# Patient Record
Sex: Female | Born: 1975 | Race: Black or African American | Hispanic: No | Marital: Single | Smoking: Current every day smoker
Health system: Southern US, Community
[De-identification: ages and names within clinical notes are randomized; demographics above are authoritative.]

## PROBLEM LIST (undated history)

## (undated) DIAGNOSIS — F419 Anxiety disorder, unspecified: Secondary | ICD-10-CM

## (undated) DIAGNOSIS — D649 Anemia, unspecified: Secondary | ICD-10-CM

---

## 2009-11-26 ENCOUNTER — Inpatient Hospital Stay (HOSPITAL_COMMUNITY): Admission: AD | Admit: 2009-11-26 | Discharge: 2009-11-26 | Payer: Self-pay | Admitting: Obstetrics & Gynecology

## 2009-11-26 ENCOUNTER — Ambulatory Visit: Payer: Self-pay | Admitting: Family Medicine

## 2010-08-07 LAB — URINALYSIS, ROUTINE W REFLEX MICROSCOPIC
Bilirubin Urine: NEGATIVE
Ketones, ur: NEGATIVE mg/dL
Nitrite: NEGATIVE
pH: 7.5 (ref 5.0–8.0)

## 2010-08-07 LAB — WET PREP, GENITAL
Trich, Wet Prep: NONE SEEN
Yeast Wet Prep HPF POC: NONE SEEN

## 2010-08-07 LAB — GC/CHLAMYDIA PROBE AMP, GENITAL: Chlamydia, DNA Probe: NEGATIVE

## 2011-06-30 ENCOUNTER — Emergency Department (HOSPITAL_COMMUNITY): Payer: Self-pay

## 2011-06-30 ENCOUNTER — Encounter (HOSPITAL_COMMUNITY): Payer: Self-pay | Admitting: Emergency Medicine

## 2011-06-30 ENCOUNTER — Emergency Department (HOSPITAL_COMMUNITY)
Admission: EM | Admit: 2011-06-30 | Discharge: 2011-06-30 | Disposition: A | Discharge status: Home / Self Care | Payer: Self-pay | Attending: Emergency Medicine | Admitting: Emergency Medicine

## 2011-06-30 DIAGNOSIS — Y998 Other external cause status: Secondary | ICD-10-CM | POA: Insufficient documentation

## 2011-06-30 DIAGNOSIS — F1010 Alcohol abuse, uncomplicated: Secondary | ICD-10-CM | POA: Insufficient documentation

## 2011-06-30 DIAGNOSIS — M542 Cervicalgia: Secondary | ICD-10-CM | POA: Insufficient documentation

## 2011-06-30 DIAGNOSIS — R51 Headache: Secondary | ICD-10-CM | POA: Insufficient documentation

## 2011-06-30 DIAGNOSIS — T148XXA Other injury of unspecified body region, initial encounter: Secondary | ICD-10-CM

## 2011-06-30 DIAGNOSIS — F17200 Nicotine dependence, unspecified, uncomplicated: Secondary | ICD-10-CM | POA: Insufficient documentation

## 2011-06-30 MED ORDER — IBUPROFEN 800 MG PO TABS: 800.0000 mg | ORAL_TABLET | Freq: Three times a day (TID) | ORAL | Status: AC

## 2011-06-30 MED ORDER — IBUPROFEN 800 MG PO TABS
800.0000 mg | ORAL_TABLET | Freq: Once | ORAL | Status: DC
  Filled 2011-06-30: qty 1

## 2011-06-30 NOTE — ED Notes (Signed)
Patient  refused  To apply ice as requested by Dr.
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
## 2011-08-26 ENCOUNTER — Emergency Department (HOSPITAL_COMMUNITY)
Admission: EM | Admit: 2011-08-26 | Discharge: 2011-08-26 | Disposition: A | Discharge status: Home / Self Care | Payer: Self-pay | Attending: Emergency Medicine | Admitting: Emergency Medicine

## 2011-08-26 ENCOUNTER — Emergency Department (HOSPITAL_COMMUNITY): Payer: Self-pay

## 2011-08-26 ENCOUNTER — Encounter (HOSPITAL_COMMUNITY): Payer: Self-pay

## 2011-08-26 DIAGNOSIS — F17200 Nicotine dependence, unspecified, uncomplicated: Secondary | ICD-10-CM | POA: Insufficient documentation

## 2011-08-26 DIAGNOSIS — S161XXA Strain of muscle, fascia and tendon at neck level, initial encounter: Secondary | ICD-10-CM

## 2011-08-26 DIAGNOSIS — W19XXXA Unspecified fall, initial encounter: Secondary | ICD-10-CM | POA: Insufficient documentation

## 2011-08-26 DIAGNOSIS — M545 Low back pain, unspecified: Secondary | ICD-10-CM | POA: Insufficient documentation

## 2011-08-26 DIAGNOSIS — S39012A Strain of muscle, fascia and tendon of lower back, initial encounter: Secondary | ICD-10-CM

## 2011-08-26 DIAGNOSIS — S139XXA Sprain of joints and ligaments of unspecified parts of neck, initial encounter: Secondary | ICD-10-CM | POA: Insufficient documentation

## 2011-08-26 DIAGNOSIS — Y9289 Other specified places as the place of occurrence of the external cause: Secondary | ICD-10-CM | POA: Insufficient documentation

## 2011-08-26 DIAGNOSIS — M542 Cervicalgia: Secondary | ICD-10-CM | POA: Insufficient documentation

## 2011-08-26 DIAGNOSIS — S335XXA Sprain of ligaments of lumbar spine, initial encounter: Secondary | ICD-10-CM | POA: Insufficient documentation

## 2011-08-26 MED ORDER — HYDROCODONE-ACETAMINOPHEN 5-325 MG PO TABS: 1.0000 | ORAL_TABLET | Freq: Four times a day (QID) | ORAL | Status: AC | PRN

## 2011-08-26 MED ORDER — PREDNISONE 50 MG PO TABS: 50.0000 mg | ORAL_TABLET | Freq: Every day | ORAL | Status: AC

## 2011-08-26 MED ORDER — DIAZEPAM 5 MG PO TABS: 5.0000 mg | ORAL_TABLET | Freq: Three times a day (TID) | ORAL | Status: AC | PRN
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
## 2011-08-26 NOTE — ED Notes (Signed)
MD at bedside. EDPA Thayer Ohm in to see this pt and answer questions for this pt

## 2011-08-26 NOTE — Discharge Instructions (Signed)
Your x-rays are normal today.  Use ice and heat on her back and neck. return here as needed
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
## 2011-08-28 NOTE — ED Provider Notes (Signed)
Medical screening examination/treatment/procedure(s) were performed by non-physician practitioner and as supervising physician I was immediately available for consultation/collaboration.   Hurman Horn, MD 08/28/11 2209

## 2011-09-17 ENCOUNTER — Emergency Department (HOSPITAL_COMMUNITY)
Admission: EM | Admit: 2011-09-17 | Discharge: 2011-09-17 | Disposition: A | Discharge status: Home / Self Care | Payer: No Typology Code available for payment source | Attending: Emergency Medicine | Admitting: Emergency Medicine

## 2011-09-17 ENCOUNTER — Emergency Department (HOSPITAL_COMMUNITY): Payer: No Typology Code available for payment source

## 2011-09-17 ENCOUNTER — Encounter (HOSPITAL_COMMUNITY): Payer: Self-pay | Admitting: *Deleted

## 2011-09-17 DIAGNOSIS — S0990XA Unspecified injury of head, initial encounter: Secondary | ICD-10-CM | POA: Insufficient documentation

## 2011-09-17 DIAGNOSIS — T148XXA Other injury of unspecified body region, initial encounter: Secondary | ICD-10-CM | POA: Insufficient documentation

## 2011-09-17 DIAGNOSIS — R110 Nausea: Secondary | ICD-10-CM | POA: Insufficient documentation

## 2011-09-17 DIAGNOSIS — S139XXA Sprain of joints and ligaments of unspecified parts of neck, initial encounter: Secondary | ICD-10-CM | POA: Insufficient documentation

## 2011-09-17 DIAGNOSIS — W19XXXA Unspecified fall, initial encounter: Secondary | ICD-10-CM | POA: Insufficient documentation

## 2011-09-17 DIAGNOSIS — S161XXA Strain of muscle, fascia and tendon at neck level, initial encounter: Secondary | ICD-10-CM

## 2011-09-17 DIAGNOSIS — M542 Cervicalgia: Secondary | ICD-10-CM | POA: Insufficient documentation

## 2011-09-17 DIAGNOSIS — IMO0002 Reserved for concepts with insufficient information to code with codable children: Secondary | ICD-10-CM | POA: Insufficient documentation

## 2011-09-17 DIAGNOSIS — S40211A Abrasion of right shoulder, initial encounter: Secondary | ICD-10-CM

## 2011-09-17 MED ORDER — DIAZEPAM 5 MG PO TABS: 5.0000 mg | ORAL_TABLET | Freq: Two times a day (BID) | ORAL | Status: AC

## 2011-09-17 MED ORDER — DIAZEPAM 5 MG PO TABS
5.0000 mg | ORAL_TABLET | Freq: Once | ORAL | Status: AC
  Administered 2011-09-17: 5 mg via ORAL
  Filled 2011-09-17: qty 1

## 2011-09-17 MED ORDER — TETANUS-DIPHTH-ACELL PERTUSSIS 5-2.5-18.5 LF-MCG/0.5 IM SUSP
0.5000 mL | Freq: Once | INTRAMUSCULAR | Status: AC
  Administered 2011-09-17: 0.5 mL via INTRAMUSCULAR
  Filled 2011-09-17: qty 0.5

## 2011-09-17 MED ORDER — HYDROCODONE-ACETAMINOPHEN 5-325 MG PO TABS: 1.0000 | ORAL_TABLET | ORAL | Status: AC | PRN

## 2011-09-17 MED ORDER — HYDROCODONE-ACETAMINOPHEN 5-325 MG PO TABS
1.0000 | ORAL_TABLET | Freq: Once | ORAL | Status: AC
  Administered 2011-09-17: 1 via ORAL
  Filled 2011-09-17: qty 1

## 2011-09-17 MED ORDER — IBUPROFEN 400 MG PO TABS: 400.0000 mg | ORAL_TABLET | Freq: Four times a day (QID) | ORAL | Status: AC | PRN

## 2011-09-17 MED ORDER — IBUPROFEN 800 MG PO TABS
800.0000 mg | ORAL_TABLET | Freq: Once | ORAL | Status: AC
  Administered 2011-09-17: 800 mg via ORAL
  Filled 2011-09-17: qty 1

## 2011-09-17 MED ORDER — OXYCODONE-ACETAMINOPHEN 5-325 MG PO TABS: 1.0000 | ORAL_TABLET | Freq: Once | ORAL | Status: DC

## 2011-09-17 MED ORDER — BACITRACIN ZINC 500 UNIT/GM EX OINT
1.0000 "application " | TOPICAL_OINTMENT | Freq: Two times a day (BID) | CUTANEOUS | Status: DC
  Administered 2011-09-17: 1 via TOPICAL
  Filled 2011-09-17 (×2): qty 0.9
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
## 2011-09-17 NOTE — Discharge Instructions (Signed)
Please read over the instructions below. The xrays of your neck tonight show no acute injury. Expect to be sore for a couple of days. Take the medications as directed. Use ice to sore areas then heat. You could have headaches associated w/ your injury for 1 to 3 weeks but they should resolve. If not you will need to follow up with your primary doctor. Keep the abrasion to your shoulder clean. Apply a topical antibiotic ointment and keep covered for the next several days.   Abrasions An abrasion is a scraped area on the skin. Abrasions do not go through all layers of the skin.  HOME CARE  Change any bandages (dressings) as told by your doctor. If the bandage sticks, soak it off with warm, soapy water. Change the bandage if it gets wet, dirty, or starts to smell.   Wash the area with soap and water twice a day. Rinse off the soap. Pat the area dry with a clean towel.   Look at the injured area for signs of infection. Infection signs include redness, puffiness (swelling), tenderness, or yellowish white fluid (pus) coming from the wound.   Apply medicated cream as told by your doctor.   Only take medicine as told by your doctor.   Follow up with your doctor as told.  GET HELP RIGHT AWAY IF:   You have more pain in your wound.   You have redness, puffiness (swelling), or tenderness around your wound.   You have yellowish white fluid (pus) coming from your wound.   You have a fever.   A bad smell is coming from the wound or bandage.  MAKE SURE YOU:   Understand these instructions.   Will watch your condition.   Will get help right away if you are not doing well or get worse.  Document Released: 10/25/2007 Document Revised: 04/27/2011 Document Reviewed: 04/11/2011 [facility] Patient Information 2012 [place], [place].Cervical Strain Care After A cervical strain is when the muscles and ligaments in your neck have been stretched. The bones are not broken. If you had any problems moving your  arms or legs immediately after the injury, even if the problem has gone away, make sure to tell this to your caregiver.  HOME CARE INSTRUCTIONS   While awake, apply ice packs to the neck or areas of pain about every 1 to 2 hours, for 15 to 20 minutes at a time. Do this for 2 days. If you were given a cervical collar for support, ask your caregiver if you may remove it for bathing or applying ice.   If given a cervical collar, wear as instructed. Do not remove any collar unless instructed by a caregiver.   Only take over-the-counter or prescription medicines for pain, discomfort, or fever as directed by your caregiver.  Recheck with the hospital or clinic after a radiologist has read your X-rays. Recheck with the hospital or clinic to make sure the initial readings are correct. Do this also to determine if you need further studies. It is your responsibility to find out your X-ray results. X-rays are sometimes repeated in one week to ten days. These are often repeated to make sure that a hairline fracture was not overlooked. Ask your caregiver how you are to find out about your radiology (X-ray) results. SEEK IMMEDIATE MEDICAL CARE IF:   You have increasing pain in your neck.   You develop difficulties swallowing or breathing.   You have numbness, weakness, or movement problems in the arms or legs.  You have difficulty walking.   You develop bowel or bladder retention or incontinence.   You have problems with walking.  MAKE SURE YOU:   Understand these instructions.   Will watch your condition.   Will get help right away if you are not doing well or get worse.  Document Released: 05/08/2005 Document Revised: 01/18/2011 Document Reviewed: 12/20/2007 [facility] Patient Information 2012 [place], [place].Head Injury, Adult A head injury happens when the head is hit really hard. A head injury may cause sleepiness, headache, throwing up (vomiting), and problems seeing. If the head injury is  really bad, you may need to stay in the hospital. HOME CARE  Have someone with you for the first 24 hours. This person should wake you up every 1 hour to check on your condition.   Only drink water or clear fluid for the rest of the day. Then, go back to your regular diet.   Only take medicines as told by your doctor. Do not take aspirin.   Do not drink alcohol for 2 days.   Do not take medicines that help your relax (sedatives) for 2 days.  Side effects may happen for up to 7 to 10 days. Watch for new problems. GET HELP RIGHT AWAY IF:   You are confused or sleepy.   You cannot be woken up.   You feel sick to your stomach (nauseous) or keep throwing up.   Your dizziness or unsteadiness is get worse, or your cannot walk.   You start to shake (convulse) or pass out (faint).   You have very bad, lasting headaches that are not helped by medicine.   You cannot use your arms or legs like normal.   You have clear or bloody fluid coming from your nose or ears.  MAKE SURE YOU:   Understand these instructions.   Will watch your condition.   Will get help right away if you are not doing well or get worse.  Document Released: 04/20/2008 Document Revised: 04/27/2011 Document Reviewed: 03/24/2009 [facility] Patient Information 2012 [place], [place].Muscle Strain A muscle strain (pulled muscle) happens when a muscle is over-stretched. Recovery usually takes 5 to 6 weeks.  HOME CARE   Put ice on the injured area.   Put ice in a plastic bag.   Place a towel between your skin and the bag.   Leave the ice on for 15 to 20 minutes at a time, every hour for the first 2 days.   Do not use the muscle for several days or until your doctor says you can. Do not use the muscle if you have pain.   Wrap the injured area with an elastic bandage for comfort. Do not put it on too tightly.   Only take medicine as told by your doctor.   Warm up before exercise. This helps prevent muscle strains.    GET HELP RIGHT AWAY IF:  There is increased pain or puffiness (swelling) in the affected area. MAKE SURE YOU:   Understand these instructions.   Will watch your condition.   Will get help right away if you are not doing well or get worse.  Document Released: 02/15/2008 Document Revised: 04/27/2011 Document Reviewed: 02/15/2008 [facility] Patient Information 2012 [place], [place].Concussion and Brain Injury A blow to the head can stop the brain from working normally (concussion). It is usually not life-threatening. However, the results of the injury can be serious. Problems caused by the injury might show up right away or days or weeks later. Getting  better might take some time. HOME CARE  Rest your body. Ways to rest your body include:   Getting plenty of sleep at night.   Going to sleep early.   Taking naps during the day when you feel tired.   Limit activities that require a lot of thought. This includes:   Time spent with homework.   Time spent with work related to a job.   TV watching.   Computer use.   Return to normal activities (driving, work, school) only when your doctor says it is okay.   Avoid high impact activity and sports until your doctor says it is okay.   Take medicines only as told by your doctor.   Do not drink alcohol until your doctor says it is okay.   Do not make important decisions without help until you feel better.   Follow up with your doctor as told.  GET HELP RIGHT AWAY IF:  You, your family, or your friends notice that:  You have bad headaches, or they get worse.   You have weakness, loss of feeling (numbness), or you feel off balance.   You keep throwing up (vomiting).   You feel tired or pass out (faint).   One black center of your eye (pupil) is larger than the other.   You twitch or shake (seize).   Your speech is not clear (slurred).   You are confused, restless, easily angered (agitated), or annoyed (irritable).   You  cannot recognize or respond to people or activities.   You have neck pain.   You have trouble being woken up.   Your behavior changes.  MAKE SURE YOU:   Understand these instructions.   Will watch your condition.   Will get help right away if you are not doing well or get worse.  Document Released: 04/26/2009 Document Revised: 04/27/2011 Document Reviewed: 04/26/2009 [facility] Patient Information 2012 [employer], [place].Cervical Strain Care After A cervical strain is when the muscles and ligaments in your neck have been stretched. The bones are not broken. If you had any problems moving your arms or legs immediately after the injury, even if the problem has gone away, make sure to tell this to your caregiver.  HOME CARE INSTRUCTIONS   While awake, apply ice packs to the neck or areas of pain about every 1 to 2 hours, for 15 to 20 minutes at a time. Do this for 2 days. If you were given a cervical collar for support, ask your caregiver if you may remove it for bathing or applying ice.   If given a cervical collar, wear as instructed. Do not remove any collar unless instructed by a caregiver.   Only take over-the-counter or prescription medicines for pain, discomfort, or fever as directed by your caregiver.  Recheck with the hospital or clinic after a radiologist has read your X-rays. Recheck with the hospital or clinic to make sure the initial readings are correct. Do this also to determine if you need further studies. It is your responsibility to find out your X-ray results. X-rays are sometimes repeated in one week to ten days. These are often repeated to make sure that a hairline fracture was not overlooked. Ask your caregiver how you are to find out about your radiology (X-ray) results. SEEK IMMEDIATE MEDICAL CARE IF:   You have increasing pain in your neck.   You develop difficulties swallowing or breathing.   You have numbness, weakness, or movement problems in the arms or legs.  You have difficulty walking.   You develop bowel or bladder retention or incontinence.   You have problems with walking.  MAKE SURE YOU:   Understand these instructions.   Will watch your condition.   Will get help right away if you are not doing well or get worse.  Document Released: 05/08/2005 Document Revised: 01/18/2011 Document Reviewed: 12/20/2007 [facility] Patient Information 2012 [place], [place].

## 2011-09-20 NOTE — ED Provider Notes (Signed)
Medical screening examination/treatment/procedure(s) were performed by non-physician practitioner and as supervising physician I was immediately available for consultation/collaboration.  Olivia Mackie, MD 09/20/11 [id number]

## 2015-01-28 ENCOUNTER — Other Ambulatory Visit (HOSPITAL_COMMUNITY): Payer: Self-pay | Admitting: *Deleted

## 2015-01-28 DIAGNOSIS — Z1231 Encounter for screening mammogram for malignant neoplasm of breast: Secondary | ICD-10-CM

## 2015-02-08 ENCOUNTER — Ambulatory Visit (HOSPITAL_COMMUNITY): Payer: No Typology Code available for payment source

## 2015-02-11 ENCOUNTER — Ambulatory Visit (HOSPITAL_COMMUNITY): Payer: Self-pay

## 2015-03-04 ENCOUNTER — Encounter: Payer: Self-pay | Admitting: Obstetrics and Gynecology

## 2015-03-04 ENCOUNTER — Ambulatory Visit (INDEPENDENT_AMBULATORY_CARE_PROVIDER_SITE_OTHER): Payer: Self-pay | Admitting: Obstetrics and Gynecology

## 2015-03-04 VITALS — BP 108/75 | HR 82 | Temp 98.7°F | Ht 61.0 in | Wt 106.0 lb

## 2015-03-04 DIAGNOSIS — D259 Leiomyoma of uterus, unspecified: Secondary | ICD-10-CM

## 2015-03-04 DIAGNOSIS — N946 Dysmenorrhea, unspecified: Secondary | ICD-10-CM

## 2015-03-04 NOTE — Progress Notes (Signed)
   Subjective:    Patient ID: Biana Haggar, female    DOB: 26-Jan-1976, 39 y.o.   MRN: [id number]  HPI  39 yo G0P0 referred for the evaluation of enlarged uterus and irregular menses. Patient reports monthly menses every 28-32 days lasting 3-4 days. She states her cycles are heavy with passage of clots. She also experiences some dysmenorrhea. Patient states she was told she has fibroids for a few years but has never been formally evaluated for it. She is not currently sexually active. She is not using any forms of contraception  History reviewed. No pertinent past medical history. History reviewed. No pertinent past surgical history. History reviewed. No pertinent family history. Social History  Substance Use Topics  . Smoking status: Current Every Day Smoker -- 1.00 packs/day    Types: Cigarettes  . Smokeless tobacco: None  . Alcohol Use: Yes     Review of Systems See pertinent in HPI    Objective:   Physical Exam GENERAL: Well-developed, well-nourished female in no acute distress.  ABDOMEN: Soft, nontender, nondistended. Palpable uterus PELVIC: Normal external female genitalia. Vagina is pink and rugated.  Normal discharge. Normal appearing cervix. Uterus is 14-16 weeks in size with palpable fibroids. No adnexal mass or tenderness. EXTREMITIES: No cyanosis, clubbing, or edema, 2+ distal pulses.      Assessment & Plan:  39 yo nulliparous with fibroid uterus and pelvic pain - Will obtain a pelvic ultrasound for further evaluation - RTC in 2 weeks for review of ultrasound results and management options

## 2015-03-10 ENCOUNTER — Ambulatory Visit (HOSPITAL_COMMUNITY): Admission: RE | Admit: 2015-03-10 | Payer: Self-pay | Source: Ambulatory Visit

## 2015-03-16 ENCOUNTER — Encounter: Payer: Self-pay | Admitting: *Deleted

## 2015-03-22 ENCOUNTER — Ambulatory Visit (HOSPITAL_COMMUNITY)
Admission: RE | Admit: 2015-03-22 | Discharge: 2015-03-22 | Disposition: A | Discharge status: Home / Self Care | Payer: Self-pay | Source: Ambulatory Visit | Attending: Obstetrics and Gynecology | Admitting: Obstetrics and Gynecology

## 2015-03-22 DIAGNOSIS — D259 Leiomyoma of uterus, unspecified: Secondary | ICD-10-CM | POA: Insufficient documentation

## 2015-10-30 ENCOUNTER — Emergency Department (HOSPITAL_COMMUNITY): Payer: Self-pay

## 2015-10-30 ENCOUNTER — Emergency Department (HOSPITAL_COMMUNITY)
Admission: EM | Admit: 2015-10-30 | Discharge: 2015-10-30 | Disposition: A | Discharge status: Home / Self Care | Payer: Self-pay | Attending: Emergency Medicine | Admitting: Emergency Medicine

## 2015-10-30 ENCOUNTER — Encounter (HOSPITAL_COMMUNITY): Payer: Self-pay | Admitting: Emergency Medicine

## 2015-10-30 DIAGNOSIS — R1084 Generalized abdominal pain: Secondary | ICD-10-CM

## 2015-10-30 DIAGNOSIS — F17210 Nicotine dependence, cigarettes, uncomplicated: Secondary | ICD-10-CM | POA: Insufficient documentation

## 2015-10-30 DIAGNOSIS — D259 Leiomyoma of uterus, unspecified: Secondary | ICD-10-CM | POA: Insufficient documentation

## 2015-10-30 DIAGNOSIS — Z79899 Other long term (current) drug therapy: Secondary | ICD-10-CM | POA: Insufficient documentation

## 2015-10-30 DIAGNOSIS — F10129 Alcohol abuse with intoxication, unspecified: Secondary | ICD-10-CM | POA: Insufficient documentation

## 2015-10-30 LAB — CBC WITH DIFFERENTIAL/PLATELET
BASOS ABS: 0 10*3/uL (ref 0.0–0.1)
Basophils Relative: 0 %
EOS PCT: 1 %
Eosinophils Absolute: 0.1 10*3/uL (ref 0.0–0.7)
HEMATOCRIT: 36.8 % (ref 36.0–46.0)
Hemoglobin: 13.1 g/dL (ref 12.0–15.0)
LYMPHS PCT: 29 %
Lymphs Abs: 2.3 10*3/uL (ref 0.7–4.0)
MCH: 34.9 pg — ABNORMAL HIGH (ref 26.0–34.0)
MCHC: 35.6 g/dL (ref 30.0–36.0)
MCV: 98.1 fL (ref 78.0–100.0)
MONO ABS: 0.9 10*3/uL (ref 0.1–1.0)
MONOS PCT: 11 %
Neutro Abs: 4.6 10*3/uL (ref 1.7–7.7)
Neutrophils Relative %: 59 %
PLATELETS: 372 10*3/uL (ref 150–400)
RBC: 3.75 MIL/uL — ABNORMAL LOW (ref 3.87–5.11)
RDW: 12.5 % (ref 11.5–15.5)
WBC: 7.9 10*3/uL (ref 4.0–10.5)

## 2015-10-30 LAB — ETHANOL: ALCOHOL ETHYL (B): 161 mg/dL — AB (ref <5)

## 2015-10-30 LAB — COMPREHENSIVE METABOLIC PANEL
ALBUMIN: 4.6 g/dL (ref 3.5–5.0)
ALT: 11 U/L — AB (ref 14–54)
AST: 19 U/L (ref 15–41)
Alkaline Phosphatase: 43 U/L (ref 38–126)
Anion gap: 8 (ref 5–15)
BILIRUBIN TOTAL: 0.2 mg/dL — AB (ref 0.3–1.2)
BUN: 8 mg/dL (ref 6–20)
CO2: 23 mmol/L (ref 22–32)
CREATININE: 0.67 mg/dL (ref 0.44–1.00)
Calcium: 8.8 mg/dL — ABNORMAL LOW (ref 8.9–10.3)
Chloride: 107 mmol/L (ref 101–111)
GFR calc Af Amer: >60 mL/min (ref >60)
GLUCOSE: 92 mg/dL (ref 65–99)
Potassium: 3.2 mmol/L — ABNORMAL LOW (ref 3.5–5.1)
Sodium: 138 mmol/L (ref 135–145)
TOTAL PROTEIN: 7.8 g/dL (ref 6.5–8.1)

## 2015-10-30 LAB — LIPASE, BLOOD: LIPASE: 42 U/L (ref 11–51)

## 2015-10-30 LAB — I-STAT BETA HCG BLOOD, ED (MC, WL, AP ONLY): I-stat hCG, quantitative: <5 m[IU]/mL (ref <5)

## 2015-10-30 MED ORDER — SODIUM CHLORIDE 0.9 % IV BOLUS (SEPSIS)
1000.0000 mL | Freq: Once | INTRAVENOUS | Status: AC
  Administered 2015-10-30: 1000 mL via INTRAVENOUS

## 2015-10-30 MED ORDER — MORPHINE SULFATE (PF) 4 MG/ML IV SOLN: 4.0000 mg | Freq: Once | INTRAVENOUS | Status: DC

## 2015-10-30 MED ORDER — POTASSIUM CHLORIDE CRYS ER 20 MEQ PO TBCR
40.0000 meq | EXTENDED_RELEASE_TABLET | Freq: Once | ORAL | Status: AC
  Administered 2015-10-30: 40 meq via ORAL
  Filled 2015-10-30: qty 2

## 2015-10-30 MED ORDER — DIATRIZOATE MEGLUMINE & SODIUM 66-10 % PO SOLN
15.0000 mL | Freq: Once | ORAL | Status: AC
  Administered 2015-10-30: 15 mL via ORAL

## 2015-10-30 MED ORDER — ONDANSETRON HCL 4 MG/2ML IJ SOLN: 4.0000 mg | Freq: Once | INTRAMUSCULAR | Status: DC

## 2015-10-30 MED ORDER — IOPAMIDOL (ISOVUE-300) INJECTION 61%
100.0000 mL | Freq: Once | INTRAVENOUS | Status: AC | PRN
  Administered 2015-10-30: 100 mL via INTRAVENOUS

## 2015-10-30 NOTE — Discharge Instructions (Signed)
You have been seen today for abdominal pain and intoxication. It is suspected the mass in the abdomen is from the swelling of the uterus due to fibroids. This was confirmed on the abdominal CT. Follow-up with OB/GYN as soon as possible on this matter. Naproxen or ibuprofen for pain. Follow up with PCP as needed. Return to ED should symptoms worsen.

## 2015-10-30 NOTE — ED Notes (Signed)
PA at bedside.

## 2015-10-30 NOTE — ED Notes (Signed)
Patient refusing IV, meds, labs at this time-will approach at later time

## 2015-10-30 NOTE — ED Notes (Signed)
Tolerated PO contrast-waiting for labs to result prior to CT

## 2015-10-30 NOTE — ED Notes (Signed)
Patient had just used rest

## 2015-10-30 NOTE — ED Notes (Signed)
Bed: [id number] Expected date:  Expected time:  Means of arrival:  Comments: EMS menstrual pain / ETOH

## 2015-10-30 NOTE — ED Notes (Signed)
Per EMS pt reported drinking two 40oz Beers tonight and also reporting menstrual cramping.

## 2015-10-30 NOTE — ED Notes (Signed)
PT refuse to have blood work. RN have been made aware

## 2015-10-30 NOTE — ED Notes (Signed)
Pt laying in bed eyes closed equal chest rise and fall no acute distress. Awaiting further orders by provider.

## 2015-10-30 NOTE — ED Provider Notes (Signed)
MSE was initiated and I personally evaluated the patient and placed orders (if any) at  4:20 AM on October 30, 2015.  The patient appears stable so that the remainder of the MSE may be completed by another provider.  The patient arrives via EMS with reported complaint of abdominal cramping secondary to menstrual cramping. Here the patient is arousable but barely coherent. She reportedly had been drinking tonight. She sits up in the bed but her complaint is labile and does not stay on topic. She complains of "hot going up my back" and points to her abdomen. She then lies back down and goes to sleep.  Reliable history is unobtainable at this time. VSS. Hypertensive - being rechecked and will monitor. Patient will be observed and further assessment will be attempted when she is clinically more sober and can state what her needs are.   BP 109/76 mmHg  Pulse 79  Temp(Src) 98.1 F (36.7 C) (Oral)  Resp 19  SpO2 99%  LMP 10/30/2015  Arousable but appears intoxicated Moves with coordination and without difficulty.  Somnolent, in NAD and no significant pain  Patient care will be signed out to [employer], PA-C, pending observation and re-evaluation to identify and assess complaint.   Charlann Lange, PA-C [phone]  Delora Fuel, MD [phone]

## 2015-10-30 NOTE — ED Provider Notes (Signed)
CSN: [place]:[id number]     Arrival date & time 10/30/15  0304 History   First MD Initiated Contact with Patient 10/30/15 0309     Chief Complaint  Patient presents with  . Alcohol Intoxication     (Consider location/radiation/quality/duration/timing/severity/associated sxs/prior Treatment) HPI   Angelica Hall is a 40 y.o. female, with a history of chronic abdominal pain, presenting to the ED with lower right quadrant abdominal and rectal pain that began suddenly yesterday morning. Pt describes the pain as "a hot knife stabbing me." Pt states she has chronic abdominal pain from an unknown source "since age 89." Pt then "drank two beers to ease the pain." Pt later reveals these were 40oz beers. This did not reduce the pain, but helped her sleep. Rates her pain at 8/10, stabbing, radiating from her rectum into her lower abdomen. Pt also endorses 4-5 episodes of diarrhea as well as nausea over the past 24 hours. Pt is currently menstruating beginning June 8. Pt denies fever/chills, hematochezia/melena, shortness of breath, urinary issues, or any other complaints.     History reviewed. No pertinent past medical history. History reviewed. No pertinent past surgical history. History reviewed. No pertinent family history. Social History  Substance Use Topics  . Smoking status: Current Every Day Smoker -- 1.00 packs/day    Types: Cigarettes  . Smokeless tobacco: None  . Alcohol Use: Yes     Comment: two 40oz beers tonight   OB History    Gravida Para Term Preterm AB TAB SAB Ectopic Multiple Living   0 0 0 0 0 0 0 0 0 0      Review of Systems  Constitutional: Negative for fever and chills.  Respiratory: Negative for shortness of breath.   Cardiovascular: Negative for chest pain.  Gastrointestinal: Positive for abdominal pain. Negative for nausea, vomiting, diarrhea, constipation and blood in stool.      Allergies  Review of patient's allergies indicates no known allergies.  Home Medications    Prior to Admission medications   Not on File   BP 128/93 mmHg  Pulse 83  Temp(Src) 98.1 F (36.7 C) (Oral)  Resp 16  SpO2 100%  LMP 10/28/2015 (Exact Date) Physical Exam  Constitutional: She is oriented to person, place, and time. She appears well-developed and well-nourished. No distress.  Noted to be sleeping comfortably on the bed. Easily arousable.   HENT:  Head: Normocephalic and atraumatic.  Eyes: Conjunctivae are normal.  Neck: Neck supple.  Cardiovascular: Normal rate, regular rhythm, normal heart sounds and intact distal pulses.   Pulmonary/Chest: Effort normal and breath sounds normal. No respiratory distress.  Abdominal: She exhibits mass (firm, baseball-sized mass in RLQ). There is tenderness in the right lower quadrant. There is no guarding.  Musculoskeletal: She exhibits no edema or tenderness.  Lymphadenopathy:    She has no cervical adenopathy.  Neurological: She is alert and oriented to person, place, and time.  No sensory deficits. Strength 5/5 in all extremities. No gait disturbance. Coordination intact. Cranial nerves III-XII grossly intact. No facial droop.   Skin: Skin is warm and dry. She is not diaphoretic.  Psychiatric: She has a normal mood and affect. Her behavior is normal.  Nursing note and vitals reviewed.   ED Course  Procedures (including critical care time) Labs Review Labs Reviewed  COMPREHENSIVE METABOLIC PANEL - Abnormal; Notable for the following:    Potassium 3.2 (*)    Calcium 8.8 (*)    ALT 11 (*)    Total Bilirubin 0.2 (*)  All other components within normal limits  ETHANOL - Abnormal; Notable for the following:    Alcohol, Ethyl (B) 161 (*)    All other components within normal limits  CBC WITH DIFFERENTIAL/PLATELET - Abnormal; Notable for the following:    RBC 3.75 (*)    MCH 34.9 (*)    All other components within normal limits  LIPASE, BLOOD  I-STAT BETA HCG BLOOD, ED ([facility], [facility], [facility] ONLY)    Results for orders placed  or performed during the hospital encounter of 10/30/15  Comprehensive metabolic panel  Result Value Ref Range   Sodium 138 135 - 145 mmol/L   Potassium 3.2 (L) 3.5 - 5.1 mmol/L   Chloride 107 101 - 111 mmol/L   CO2 23 22 - 32 mmol/L   Glucose, Bld 92 65 - 99 mg/dL   BUN 8 6 - 20 mg/dL   Creatinine, Ser 0.67 0.44 - 1.00 mg/dL   Calcium 8.8 (L) 8.9 - 10.3 mg/dL   Total Protein 7.8 6.5 - 8.1 g/dL   Albumin 4.6 3.5 - 5.0 g/dL   AST 19 15 - 41 U/L   ALT 11 (L) 14 - 54 U/L   Alkaline Phosphatase 43 38 - 126 U/L   Total Bilirubin 0.2 (L) 0.3 - 1.2 mg/dL   GFR calc non Af Amer >60 >60 mL/min   GFR calc Af Amer >60 >60 mL/min   Anion gap 8 5 - 15  Ethanol  Result Value Ref Range   Alcohol, Ethyl (B) 161 (H) <5 mg/dL  CBC with Differential  Result Value Ref Range   WBC 7.9 4.0 - 10.5 K/uL   RBC 3.75 (L) 3.87 - 5.11 MIL/uL   Hemoglobin 13.1 12.0 - 15.0 g/dL   HCT 36.8 36.0 - 46.0 %   MCV 98.1 78.0 - 100.0 fL   MCH 34.9 (H) 26.0 - 34.0 pg   MCHC 35.6 30.0 - 36.0 g/dL   RDW 12.5 11.5 - 15.5 %   Platelets 372 150 - 400 K/uL   Neutrophils Relative % 59 %   Neutro Abs 4.6 1.7 - 7.7 K/uL   Lymphocytes Relative 29 %   Lymphs Abs 2.3 0.7 - 4.0 K/uL   Monocytes Relative 11 %   Monocytes Absolute 0.9 0.1 - 1.0 K/uL   Eosinophils Relative 1 %   Eosinophils Absolute 0.1 0.0 - 0.7 K/uL   Basophils Relative 0 %   Basophils Absolute 0.0 0.0 - 0.1 K/uL  Lipase, blood  Result Value Ref Range   Lipase 42 11 - 51 U/L  I-Stat beta hCG blood, ED  Result Value Ref Range   I-stat hCG, quantitative <5.0 <5 mIU/mL   Comment 3           Ct Abdomen Pelvis W Contrast  10/30/2015  CLINICAL DATA:  intoxicated patient complaining of menstrual cramping. PA evaluation: The patient arrives via EMS with reported complaint of abdominal cramping secondary to menstrual cramping. Here the patient is arousable but barely coherent. She reportedly had been drinking tonight. She sits up in the bed but her complaint  is labile and does not stay on topic. She complains of "hot going up my back" and points to her abdomen. She then lies back down and goes to sleep. Reliable history is unobtainable at this time. VSS. Hypertensive - being rechecked and will monitor. Patient will be observed and further assessment will be attempted when she is clinically more sober and can state what her needs are. EXAM: CT  ABDOMEN AND PELVIS WITH CONTRAST TECHNIQUE: Multidetector CT imaging of the abdomen and pelvis was performed using the standard protocol following bolus administration of intravenous contrast. CONTRAST:  154mL ISOVUE-300 IOPAMIDOL (ISOVUE-300) INJECTION 61% COMPARISON:  None. FINDINGS: Lower chest: Mild dependent atelectasis posteriorly in the visualized lung bases. No pleural or pericardial effusion. Hepatobiliary: No masses or other significant abnormality. Gallbladder is incompletely distended. Pancreas: No mass, inflammatory changes, or other significant abnormality. Spleen: Within normal limits in size and appearance. Adrenals/Urinary Tract: No masses identified. No evidence of hydronephrosis. Stomach/Bowel: No evidence of obstruction, inflammatory process, or abnormal fluid collections. Vascular/Lymphatic: No pathologically enlarged lymph nodes. No evidence of abdominal aortic aneurysm. Reproductive: Marked uterine enlargement 17.7 x 10.1 x 16.5 cm, extending up to the level of the umbilicus, containing multiple nodular enhancing regions consistent with fibroids. There is a small amount of fluid in the endometrial cavity which is distorted. No adnexal pathology identified. Other: No ascites.  No free air. Musculoskeletal:  No suspicious bone lesions identified. IMPRESSION: 1. Enlarged myomatous uterus. 2. No acute abdominal process. Electronically Signed   By: Lucrezia Europe M.D.   On: 10/30/2015 10:31     MDM   Final diagnoses:  Generalized abdominal pain  Uterine leiomyoma, unspecified location    [place]  presents initially with intoxication, but then with abdominal and rectal pain.  Received patient care handoff report from Charlann Lange, PA-C. Also see MSE for initial details.  Plan: Patient is to be intermittently evaluated until intoxication has subsided to the point that allows a thorough examination. Findings and plan of care discussed with Dorie Rank, MD. Dr. Tomi Bamberger personally evaluated and examined this patient. 7:39 AM Pt is now able to speak coherently and able to provide a proper history. When asked about the mass in her abdomen, she states this is new, even from last night. Patient initially refused all interventions, but consented after they were explained to her. Patient confirms the pain and the mass is new for her. Pt has known, large (7-8 cm) uterine fibroids previously documented, but no documentation of palpable abdominal mass. Pt was thought to have eloped from the ED, but then returned stating she "needed a smoke." Abdominal CT results showed increase in the size of the patient's uterus and fibroids. Patient advised to follow-up with OB/GYN on this matter. Return precautions discussed. Patient voiced understanding of these instructions and is comfortable with discharge.  Filed Vitals:   10/30/15 0313 10/30/15 0433 10/30/15 0632  BP: 137/110 109/76 111/81  Pulse: 83 79 76  Temp: 98.1 F (36.7 C)    TempSrc: Oral    Resp: 20 19 18   SpO2: 100% 99% 99%   Filed Vitals:   10/30/15 0433 10/30/15 0632 10/30/15 0920 10/30/15 1134  BP: 109/76 111/81 113/86 128/93  Pulse: 79 76 74 83  Temp:      TempSrc:      Resp: 19 18 18 16   SpO2: 99% 99% 100% 100%      Lorayne Bender, PA-C 10/30/15 [place], PA-C [id number]  David Glick, MD [id number]

## 2015-10-30 NOTE — ED Notes (Signed)
Awaiting further orders from provider. Pt laying in bed no acute distress at this time.

## 2015-10-30 NOTE — ED Notes (Signed)
Off floor for testing 

## 2015-10-30 NOTE — ED Provider Notes (Signed)
I examined the patient at approximately 0815.  Pt brought into the ED initially after drinking alcohol and complaining of abdominal pain.  History was difficult to obtain initially according to the report.  At the time of my exam, pt was awake and alert.  Not slurring her speech.  I did appreciate a mass in her lower abdomen.  She had mild ttp.  Previous imaging tests were reviewed.  Pt has a history of large uterine fibroids.  It is possible this is the mass appreciated on exam.  Plan was to check labs and a CT scan.  Pt ended up eloping after our evaluation and before any testing was performed.  Dorie Rank, MD 10/30/15 [phone]

## 2016-06-10 IMAGING — US US PELVIS COMPLETE
1 series · 15 of 25 positions shown · non-contrast
Comparison: None

CLINICAL DATA: Uterine leiomyoma

EXAM:
TRANSABDOMINAL AND TRANSVAGINAL ULTRASOUND OF PELVIS
TECHNIQUE: Both transabdominal and transvaginal ultrasound examinations of the
pelvis were performed. Transabdominal technique was performed for
global imaging of the pelvis including uterus, ovaries, adnexal
regions, and pelvic cul-de-sac. It was necessary to proceed with
endovaginal exam following the transabdominal exam to visualize the
ovaries and adnexal structures..

[Series 1: us pelvis complete · 15 of 77 slices shown]
[im 1/77]
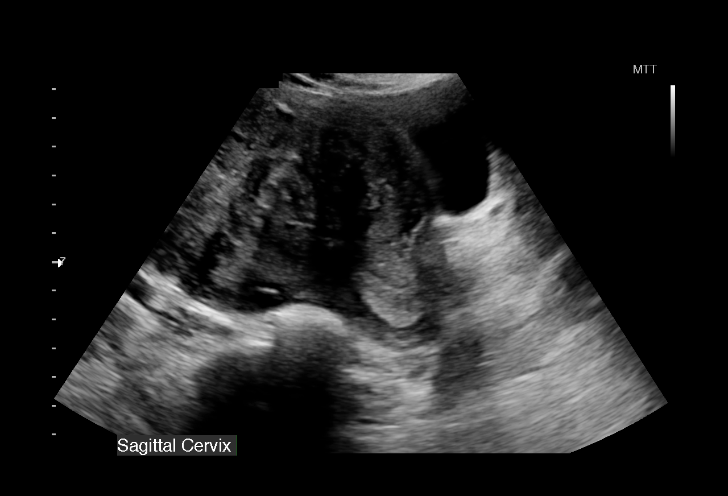
[im 7/77]
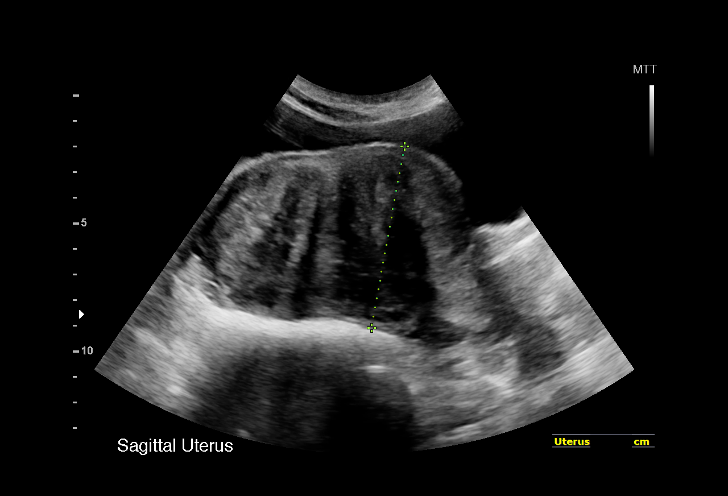
[im 13/77]
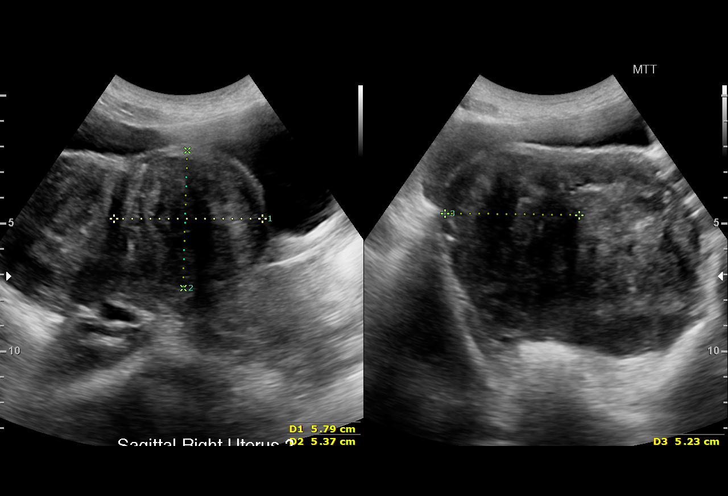
[im 16/77]
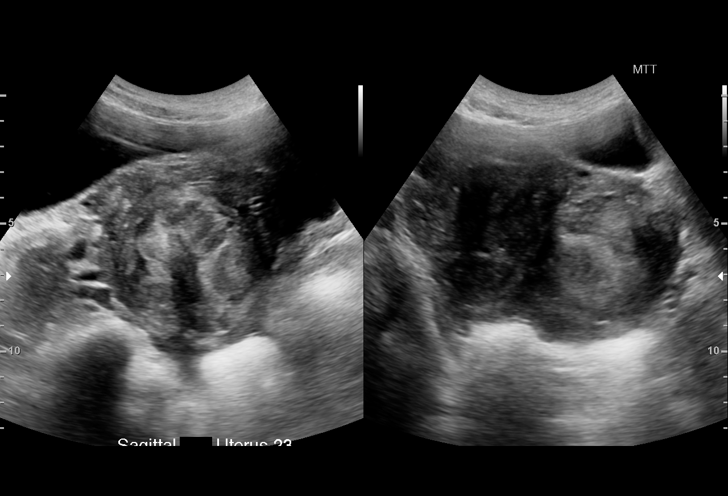
[im 23/77]
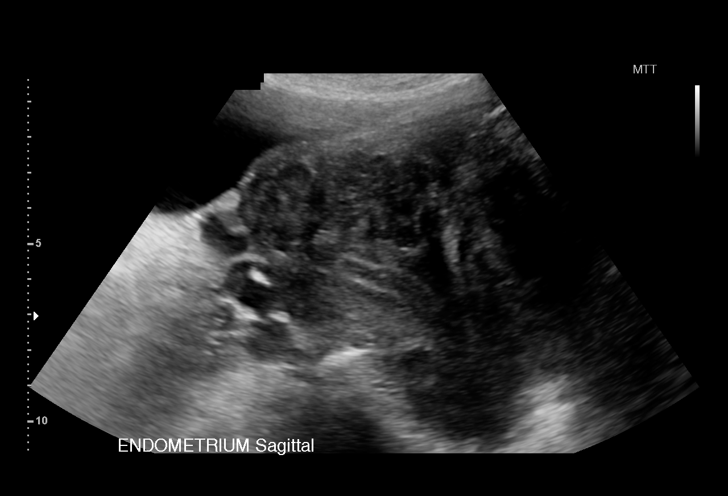
[im 29/77]
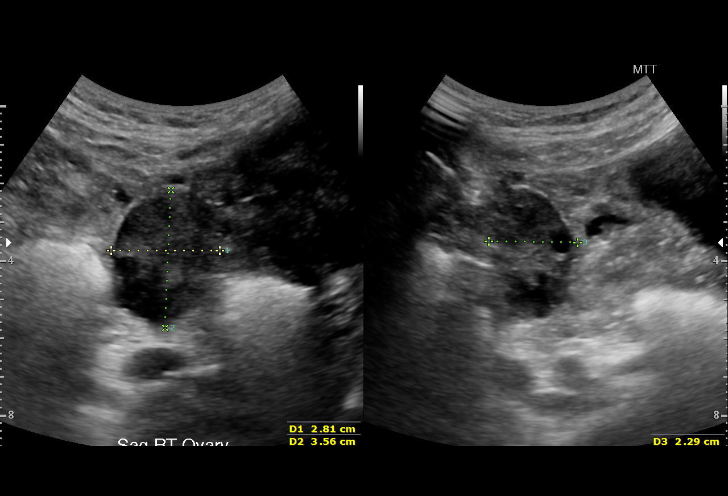
[im 32/77]
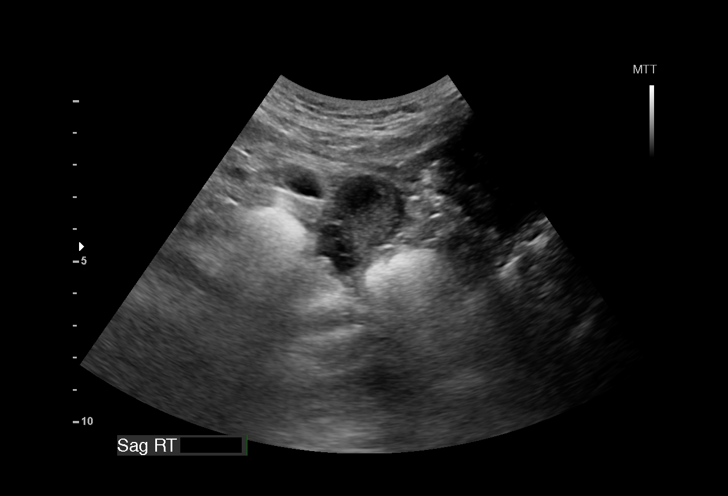
[im 39/77]
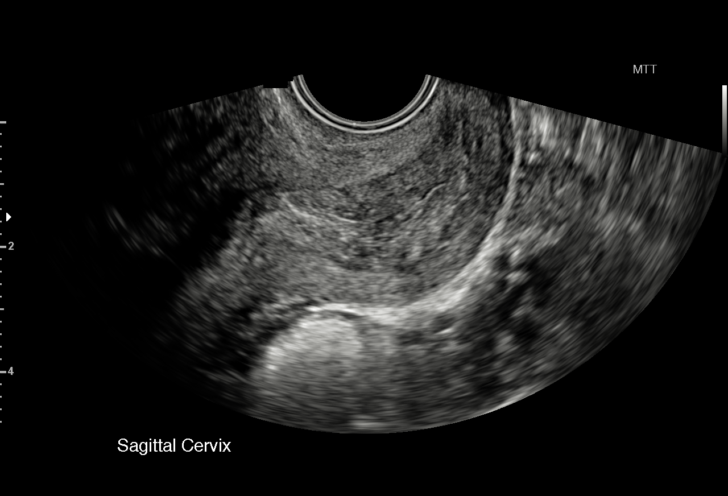
[im 45/77]
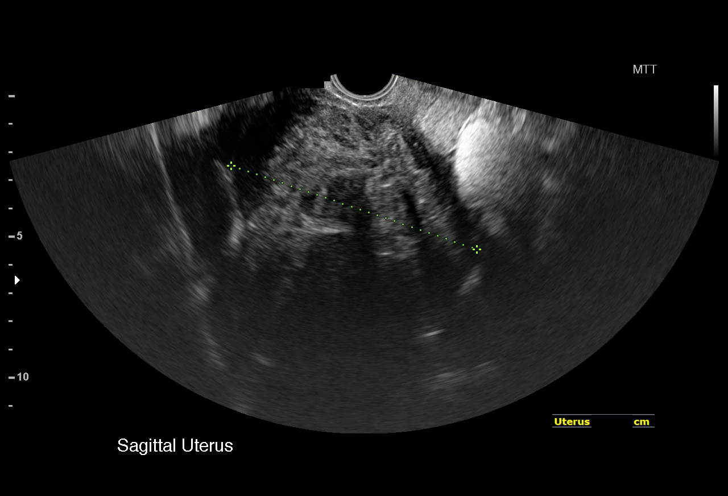
[im 48/77]
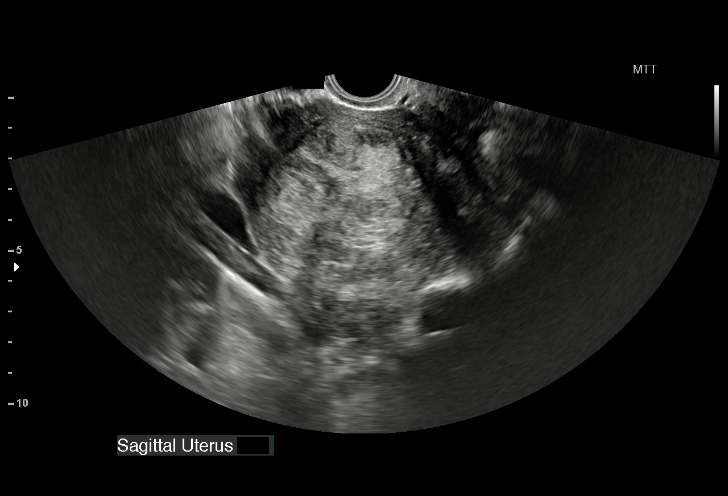
[im 54/77]
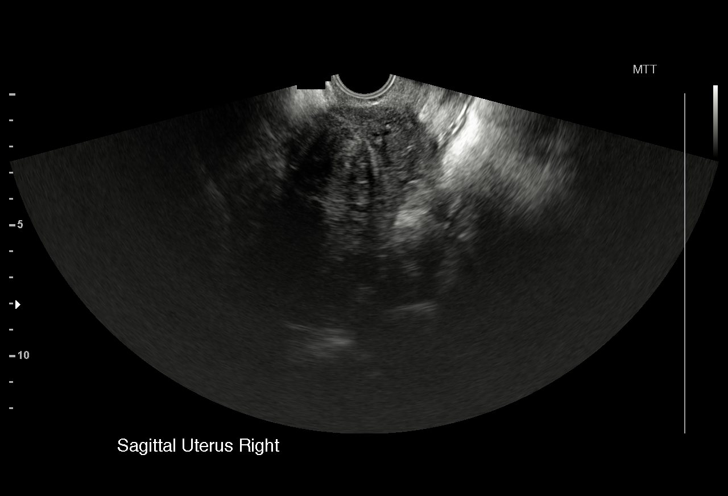
[im 61/77]
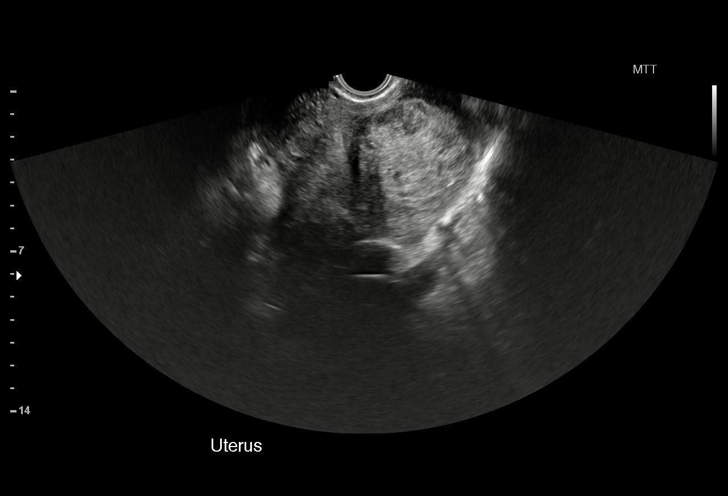
[im 64/77]
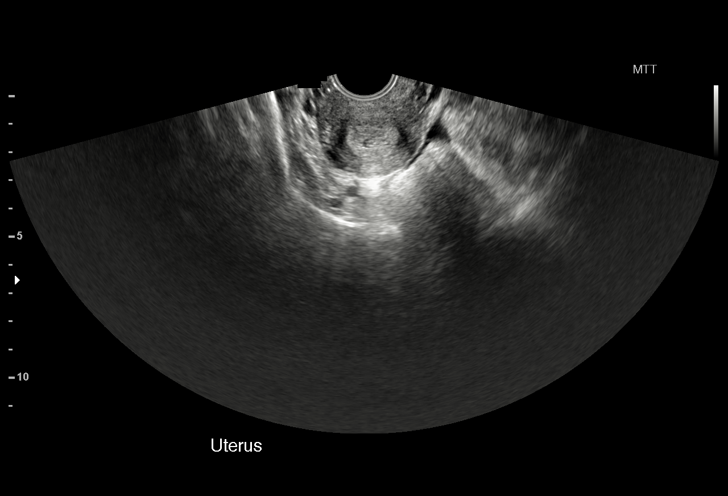
[im 70/77]
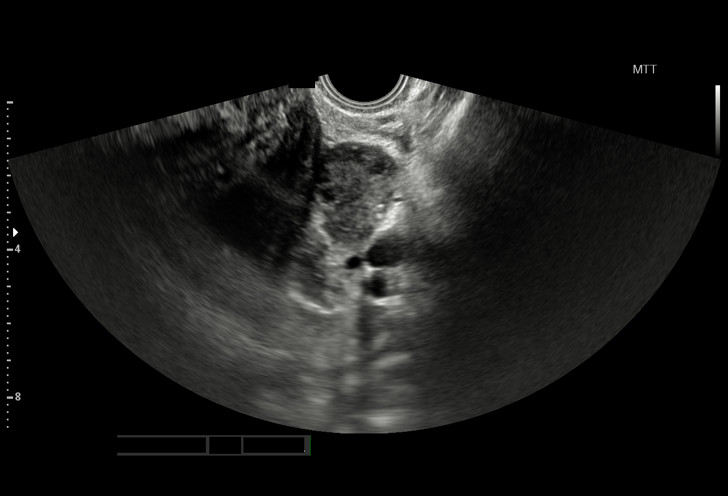
[im 77/77]
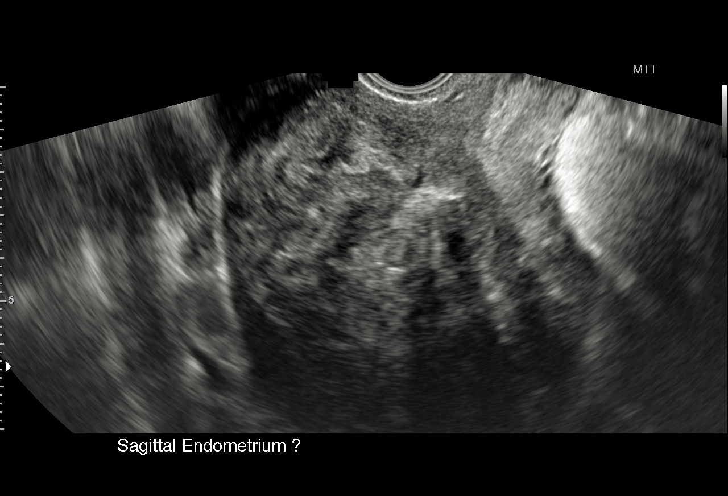

[15 of 25 positions shown; findings below may reference images not displayed]

FINDINGS: Uterus

Measurements: 13 x 9.2 x 12.3 cm.. Multiple uterine fibroids are
identified. The largest fibroid arises from the right side of the
fundus. This is subserosal and measures 6.3 x 5.8 x 6.9 cm. Arising
from the left lateral myometrium there is a subserosal fibroid
measuring 5.7 x 5.7 x 5.3 cm.

Endometrium

Thickness: 8.5 mm..  No focal abnormality visualized.

Right ovary

Measurements: 2.8 x 3.6 x 2.3 cm. Normal appearance/no adnexal mass.

Left ovary

Measurements: 3.0 x 2.1 x 2.1 cm. Normal appearance/no adnexal mass.

Other findings

Trace free fluid identified.
IMPRESSION: 1. Enlarged fibroid uterus.
2. Unremarkable appearance of the endometrium. If bleeding remains
unresponsive to hormonal or medical therapy, sonohysterogram should
be considered for focal lesion work-up. (Ref: Radiological
Reasoning: Algorithmic Workup of Abnormal Vaginal Bleeding with
Endovaginal Sonography and Sonohysterography. AJR 6446; 191:S68-73)

## 2017-01-15 ENCOUNTER — Telehealth: Payer: Self-pay | Admitting: General Practice

## 2017-01-15 ENCOUNTER — Encounter: Payer: Self-pay | Admitting: Obstetrics and Gynecology

## 2017-01-15 ENCOUNTER — Ambulatory Visit: Payer: Self-pay | Admitting: Obstetrics and Gynecology

## 2017-01-15 NOTE — Telephone Encounter (Addendum)
Patient no showed for appt today. Per Dr Elly Modena, should try to reschedule. Called patient, no answer unable to leave VM- phone just keeps ringing. Will send letter

## 2017-03-19 ENCOUNTER — Ambulatory Visit: Payer: Self-pay | Admitting: Obstetrics & Gynecology

## 2017-03-22 ENCOUNTER — Ambulatory Visit: Payer: Self-pay | Admitting: Obstetrics and Gynecology

## 2017-03-30 ENCOUNTER — Ambulatory Visit (INDEPENDENT_AMBULATORY_CARE_PROVIDER_SITE_OTHER): Payer: Self-pay | Admitting: Obstetrics & Gynecology

## 2017-03-30 ENCOUNTER — Encounter: Payer: Self-pay | Admitting: Obstetrics & Gynecology

## 2017-03-30 ENCOUNTER — Other Ambulatory Visit (HOSPITAL_COMMUNITY)
Admission: RE | Admit: 2017-03-30 | Discharge: 2017-03-30 | Disposition: A | Discharge status: Home / Self Care | Payer: Self-pay | Source: Ambulatory Visit | Attending: Obstetrics & Gynecology | Admitting: Obstetrics & Gynecology

## 2017-03-30 VITALS — BP 122/84 | HR 85 | Wt 114.6 lb

## 2017-03-30 DIAGNOSIS — N841 Polyp of cervix uteri: Secondary | ICD-10-CM | POA: Insufficient documentation

## 2017-03-30 DIAGNOSIS — D219 Benign neoplasm of connective and other soft tissue, unspecified: Secondary | ICD-10-CM

## 2017-03-30 DIAGNOSIS — Z0000 Encounter for general adult medical examination without abnormal findings: Secondary | ICD-10-CM

## 2017-03-30 MED ORDER — MISOPROSTOL 200 MCG PO TABS: ORAL_TABLET | ORAL | 0 refills | Status: DC

## 2017-03-30 NOTE — Progress Notes (Signed)
[place] scheduled for November 16th @ 1500.  Pt notified.   Mammogram scholarship faxed to the [facility].  Free Pap screening # given to pt.

## 2017-03-30 NOTE — Progress Notes (Signed)
Patient ID: Angelica Hall, female   DOB: 1975/11/30, 41 y.o.   MRN: [id number]  Chief Complaint  Patient presents with  . Fibroids    HPI Angelica Hall is a 41 y.o. female.   HPI 41 yo Single AA P0 here today due to her h/o fibroids, "extreme PMS", heavy periods, painful periods (with radaition to legs and back), dysparuenia. She is currently abstinent for 7-8 years.  No past medical history on file.  No past surgical history on file.  No family history on file.  Social History Social History   Tobacco Use  . Smoking status: Current Every Day Smoker    Packs/day: 1.00    Types: Cigarettes  . Smokeless tobacco: Never Used  Substance Use Topics  . Alcohol use: Yes    Comment: two 40oz beers tonight  . Drug use: No    No Known Allergies  Current Outpatient Medications  Medication Sig Dispense Refill  . ferrous sulfate 325 (65 FE) MG tablet Take 325 mg daily with breakfast by mouth.    . gabapentin (NEURONTIN) 100 MG capsule Take 100 mg at bedtime by mouth.     No current facility-administered medications for this visit.     Review of Systems Review of Systems  When she is sexually active, she uses condoms Her periods are irregular, about q 3 weeks, currently spotting. She may want a child in the future No currently employed  Blood pressure 122/84, pulse 85, weight 114 lb 9.6 oz (52 kg).  Physical Exam Physical Exam Breathing, conversing, and ambulating normally Well nourished, well hydrated Blackfemale, no apparent distress Abd- benign, uterus palpable up to Umbilicus+1 Large cervical polyp seen with speculum exam. I removed it easily after prepping the cervix with betadine. She tolerated the procedure well.  Data Reviewed IMPRESSION: 1. Enlarged fibroid uterus. 2. Unremarkable appearance of the endometrium. If bleeding remains unresponsive to hormonal or medical therapy, sonohysterogram should be considered for focal lesion work-up. (Ref:  Radiological Reasoning: Algorithmic Workup of Abnormal Vaginal Bleeding with Endovaginal Sonography and Sonohysterography. AJR 2008; 425:Z56-38)   Assessment    Symptomatic fibroids- no u/s since 2016 Preventative care    Plan   free pap smear clinic # given Schedule gyn u/s Come back in about 2 weeks Mammogram scholarship paperwork [facility] at next visit Fill out paperwork for free care       [place] 03/30/2017, 10:05 AM

## 2017-04-06 ENCOUNTER — Ambulatory Visit (HOSPITAL_COMMUNITY): Payer: Self-pay

## 2017-04-10 ENCOUNTER — Other Ambulatory Visit (HOSPITAL_COMMUNITY): Payer: Self-pay | Admitting: Obstetrics & Gynecology

## 2017-04-10 ENCOUNTER — Ambulatory Visit (HOSPITAL_COMMUNITY)
Admission: RE | Admit: 2017-04-10 | Discharge: 2017-04-10 | Disposition: A | Discharge status: Home / Self Care | Payer: Self-pay | Source: Ambulatory Visit | Attending: Obstetrics & Gynecology | Admitting: Obstetrics & Gynecology

## 2017-04-10 DIAGNOSIS — D259 Leiomyoma of uterus, unspecified: Secondary | ICD-10-CM | POA: Insufficient documentation

## 2017-04-10 DIAGNOSIS — D219 Benign neoplasm of connective and other soft tissue, unspecified: Secondary | ICD-10-CM

## 2017-04-10 DIAGNOSIS — Z1231 Encounter for screening mammogram for malignant neoplasm of breast: Secondary | ICD-10-CM

## 2017-04-27 ENCOUNTER — Other Ambulatory Visit (HOSPITAL_COMMUNITY)
Admission: RE | Admit: 2017-04-27 | Discharge: 2017-04-27 | Disposition: A | Discharge status: Home / Self Care | Payer: Self-pay | Source: Ambulatory Visit | Attending: Obstetrics & Gynecology | Admitting: Obstetrics & Gynecology

## 2017-04-27 ENCOUNTER — Ambulatory Visit (INDEPENDENT_AMBULATORY_CARE_PROVIDER_SITE_OTHER): Payer: Self-pay | Admitting: Obstetrics & Gynecology

## 2017-04-27 ENCOUNTER — Encounter: Payer: Self-pay | Admitting: Obstetrics & Gynecology

## 2017-04-27 VITALS — BP 131/91 | HR 90 | Ht 61.0 in | Wt 115.0 lb

## 2017-04-27 DIAGNOSIS — Z3202 Encounter for pregnancy test, result negative: Secondary | ICD-10-CM

## 2017-04-27 DIAGNOSIS — N920 Excessive and frequent menstruation with regular cycle: Secondary | ICD-10-CM

## 2017-04-27 LAB — POCT PREGNANCY, URINE: Preg Test, Ur: NEGATIVE

## 2017-04-27 NOTE — Progress Notes (Signed)
   Subjective:    Patient ID: Angelica Hall, female    DOB: [birth year], 41 y.o.   MRN: [id number]  HPI 41 yo single P0 here for a EMBX. She has very painful heavy periods.  Her hbg was 13 last year and her uterus is palpable to the umbilicus. Her u/s showed multiple fibroids. She took cytotec last night.   Review of Systems     Objective:   Physical Exam Breathing, conversing, and ambulating normally  UPT negative, consent signed, time out done Cervix prepped with betadine and grasped with a single tooth tenaculum Uterus sounded to 9 cm Pipelle used for 2 passes with a moderate amount of tissue obtained. She tolerated the procedure well.     Assessment & Plan:  Menorrhagia- await EMBX. She is relatively resistant to the thought of a hysterectomy Come back 2 weeks

## 2017-05-18 ENCOUNTER — Ambulatory Visit: Payer: Self-pay | Admitting: Obstetrics & Gynecology

## 2017-05-30 ENCOUNTER — Ambulatory Visit: Payer: Self-pay | Admitting: Obstetrics & Gynecology

## 2017-06-06 ENCOUNTER — Ambulatory Visit (INDEPENDENT_AMBULATORY_CARE_PROVIDER_SITE_OTHER): Payer: Self-pay | Admitting: Obstetrics & Gynecology

## 2017-06-06 ENCOUNTER — Telehealth: Payer: Self-pay | Admitting: *Deleted

## 2017-06-06 ENCOUNTER — Encounter: Payer: Self-pay | Admitting: Obstetrics & Gynecology

## 2017-06-06 VITALS — BP 126/91 | HR 97 | Wt 119.3 lb

## 2017-06-06 DIAGNOSIS — N920 Excessive and frequent menstruation with regular cycle: Secondary | ICD-10-CM

## 2017-06-06 DIAGNOSIS — D219 Benign neoplasm of connective and other soft tissue, unspecified: Secondary | ICD-10-CM

## 2017-06-06 NOTE — Telephone Encounter (Signed)
Called pt to see if she might be able to come in early. Left message.

## 2017-06-06 NOTE — Progress Notes (Signed)
   Subjective:    Patient ID: Angelica Hall, female    DOB: [birth year], 42 y.o.   MRN: [id number]  HPI  42 year old single P0 here to discuss her EMBX. She has DUB and large fibroids (uterus palpable up to her umbilicus). She desperately does NOT want a hysterectomy, holding onto hope that she may have a child. She has not been sexually active for more than 7 years.  Review of Systems     Objective:   Physical Exam  Breathing, conversing, and ambulating normally Well nourished, well hydrated Black female, no apparent distress     Assessment & Plan:  DUB with large fibroids- plan for myomectomy.  She understands the risks of surgery, including, but not to infection, bleeding, DVTs, damage to bowel, bladder, ureters. She wishes to proceed.

## 2017-06-07 ENCOUNTER — Encounter (HOSPITAL_COMMUNITY): Payer: Self-pay

## 2017-07-30 NOTE — Patient Instructions (Signed)
Your procedure is scheduled on: Thursday August 09, 2017 at 3:00 pm  Enter through the Main Entrance of [facility] at: 1:30 pm  Pick up the phone at the desk and dial [phone].  Call this number if you have problems the morning of surgery: [phone].  Remember: Do NOT eat food after: Midnight on Wednesday March 20 Do NOT drink clear liquids after: 9:00 am day of surgery Take these medicines the morning of surgery with a SIP OF WATER:  STOP ALL HERBAL MEDICATIONS AND SUPPLEMENTS 1 WEEK PRIOR TO SURGERY  DO NOT SMOKE DAY OF SURGERY  Do NOT wear jewelry (body piercing), metal hair clips/bobby pins, make-up, or nail polish. Do NOT wear lotions, powders, or perfumes.  You may wear deoderant. Do NOT shave for 48 hours prior to surgery. Do NOT bring valuables to the hospital. Contacts, dentures, or bridgework may not be worn into surgery. Leave suitcase in car.  After surgery it may be brought to your room.   For patients admitted to the hospital, checkout time is 11:00 AM the day of discharge.

## 2017-08-01 ENCOUNTER — Encounter (HOSPITAL_COMMUNITY): Payer: Self-pay

## 2017-08-01 ENCOUNTER — Encounter (HOSPITAL_COMMUNITY)
Admission: RE | Admit: 2017-08-01 | Discharge: 2017-08-01 | Disposition: A | Discharge status: Home / Self Care | Payer: Self-pay | Source: Ambulatory Visit | Attending: Obstetrics & Gynecology | Admitting: Obstetrics & Gynecology

## 2017-08-01 ENCOUNTER — Other Ambulatory Visit: Payer: Self-pay

## 2017-08-01 DIAGNOSIS — Z01812 Encounter for preprocedural laboratory examination: Secondary | ICD-10-CM | POA: Insufficient documentation

## 2017-08-01 HISTORY — DX: Anemia, unspecified: D64.9

## 2017-08-01 HISTORY — DX: Anxiety disorder, unspecified: F41.9

## 2017-08-01 LAB — CBC
HEMATOCRIT: 36.6 % (ref 36.0–46.0)
HEMOGLOBIN: 12.8 g/dL (ref 12.0–15.0)
MCH: 33.5 pg (ref 26.0–34.0)
MCHC: 35 g/dL (ref 30.0–36.0)
MCV: 95.8 fL (ref 78.0–100.0)
Platelets: 334 10*3/uL (ref 150–400)
RBC: 3.82 MIL/uL — ABNORMAL LOW (ref 3.87–5.11)
RDW: 13 % (ref 11.5–15.5)
WBC: 11.1 10*3/uL — AB (ref 4.0–10.5)

## 2017-08-09 ENCOUNTER — Encounter (HOSPITAL_COMMUNITY): Payer: Self-pay | Admitting: Anesthesiology

## 2017-08-09 ENCOUNTER — Inpatient Hospital Stay (HOSPITAL_COMMUNITY): Payer: Self-pay | Admitting: Anesthesiology

## 2017-08-09 ENCOUNTER — Inpatient Hospital Stay (HOSPITAL_COMMUNITY)
Admission: AD | Admit: 2017-08-09 | Discharge: 2017-08-11 | DRG: 743 | Disposition: A | Discharge status: Home / Self Care | Payer: Self-pay | Source: Ambulatory Visit | Attending: Obstetrics & Gynecology | Admitting: Obstetrics & Gynecology

## 2017-08-09 ENCOUNTER — Encounter (HOSPITAL_COMMUNITY)
Admission: AD | Disposition: A | Discharge status: Home / Self Care | Payer: Self-pay | Source: Ambulatory Visit | Attending: Obstetrics & Gynecology

## 2017-08-09 ENCOUNTER — Other Ambulatory Visit: Payer: Self-pay

## 2017-08-09 DIAGNOSIS — Z9889 Other specified postprocedural states: Secondary | ICD-10-CM

## 2017-08-09 DIAGNOSIS — D251 Intramural leiomyoma of uterus: Principal | ICD-10-CM | POA: Diagnosis present

## 2017-08-09 DIAGNOSIS — F17210 Nicotine dependence, cigarettes, uncomplicated: Secondary | ICD-10-CM | POA: Diagnosis present

## 2017-08-09 DIAGNOSIS — N938 Other specified abnormal uterine and vaginal bleeding: Secondary | ICD-10-CM | POA: Diagnosis present

## 2017-08-09 DIAGNOSIS — R102 Pelvic and perineal pain: Secondary | ICD-10-CM | POA: Diagnosis present

## 2017-08-09 DIAGNOSIS — N946 Dysmenorrhea, unspecified: Secondary | ICD-10-CM | POA: Diagnosis present

## 2017-08-09 HISTORY — PX: MYOMECTOMY: SHX85

## 2017-08-09 LAB — BASIC METABOLIC PANEL
Anion gap: 9 (ref 5–15)
BUN: 10 mg/dL (ref 6–20)
CHLORIDE: 105 mmol/L (ref 101–111)
CO2: 21 mmol/L — ABNORMAL LOW (ref 22–32)
CREATININE: 0.67 mg/dL (ref 0.44–1.00)
Calcium: 8.6 mg/dL — ABNORMAL LOW (ref 8.9–10.3)
GFR calc Af Amer: >60 mL/min (ref >60)
GFR calc non Af Amer: >60 mL/min (ref >60)
GLUCOSE: 89 mg/dL (ref 65–99)
POTASSIUM: 3.6 mmol/L (ref 3.5–5.1)
Sodium: 135 mmol/L (ref 135–145)

## 2017-08-09 LAB — PREPARE RBC (CROSSMATCH)

## 2017-08-09 LAB — ABO/RH: ABO/RH(D): A POS

## 2017-08-09 LAB — PREGNANCY, URINE: PREG TEST UR: NEGATIVE

## 2017-08-09 SURGERY — MYOMECTOMY, ABDOMINAL APPROACH
Anesthesia: General | Site: Abdomen

## 2017-08-09 MED ORDER — FENTANYL CITRATE (PF) 100 MCG/2ML IJ SOLN
INTRAMUSCULAR | Status: DC | PRN
  Administered 2017-08-09: 50 ug via INTRAVENOUS
  Administered 2017-08-09: 100 ug via INTRAVENOUS
  Administered 2017-08-09 (×2): 50 ug via INTRAVENOUS

## 2017-08-09 MED ORDER — HYDROMORPHONE HCL 1 MG/ML IJ SOLN
INTRAMUSCULAR | Status: AC
  Administered 2017-08-09: 0.5 mg via INTRAVENOUS
  Filled 2017-08-09: qty 1

## 2017-08-09 MED ORDER — SCOPOLAMINE 1 MG/3DAYS TD PT72
MEDICATED_PATCH | TRANSDERMAL | Status: AC
  Administered 2017-08-09: 1.5 mg via TRANSDERMAL
  Filled 2017-08-09: qty 1

## 2017-08-09 MED ORDER — ONDANSETRON HCL 4 MG/2ML IJ SOLN
INTRAMUSCULAR | Status: DC | PRN
  Administered 2017-08-09: 4 mg via INTRAVENOUS

## 2017-08-09 MED ORDER — ONDANSETRON HCL 4 MG PO TABS: 4.0000 mg | ORAL_TABLET | Freq: Four times a day (QID) | ORAL | Status: DC | PRN

## 2017-08-09 MED ORDER — SCOPOLAMINE 1 MG/3DAYS TD PT72
1.0000 | MEDICATED_PATCH | Freq: Once | TRANSDERMAL | Status: DC
  Administered 2017-08-09: 1.5 mg via TRANSDERMAL

## 2017-08-09 MED ORDER — ROCURONIUM BROMIDE 100 MG/10ML IV SOLN
INTRAVENOUS | Status: DC | PRN
  Administered 2017-08-09: 10 mg via INTRAVENOUS
  Administered 2017-08-09 (×4): 5 mg via INTRAVENOUS
  Administered 2017-08-09: 10 mg via INTRAVENOUS
  Administered 2017-08-09 (×2): 5 mg via INTRAVENOUS
  Administered 2017-08-09: 40 mg via INTRAVENOUS

## 2017-08-09 MED ORDER — HYDROMORPHONE HCL 1 MG/ML IJ SOLN
0.2000 mg | INTRAMUSCULAR | Status: DC | PRN
  Administered 2017-08-09: 0.6 mg via INTRAVENOUS
  Administered 2017-08-09: 0.5 mg via INTRAVENOUS
  Administered 2017-08-10 (×2): 0.6 mg via INTRAVENOUS
  Filled 2017-08-09 (×4): qty 1

## 2017-08-09 MED ORDER — LIDOCAINE HCL (CARDIAC) 20 MG/ML IV SOLN
INTRAVENOUS | Status: AC
  Filled 2017-08-09: qty 5

## 2017-08-09 MED ORDER — BUPIVACAINE HCL (PF) 0.5 % IJ SOLN
INTRAMUSCULAR | Status: DC | PRN
  Administered 2017-08-09: 30 mL

## 2017-08-09 MED ORDER — ALBUMIN HUMAN 5 % IV SOLN
INTRAVENOUS | Status: DC | PRN
  Administered 2017-08-09: 17:00:00 via INTRAVENOUS

## 2017-08-09 MED ORDER — PROPOFOL 10 MG/ML IV BOLUS
INTRAVENOUS | Status: AC
  Filled 2017-08-09: qty 20

## 2017-08-09 MED ORDER — ROCURONIUM BROMIDE 100 MG/10ML IV SOLN
INTRAVENOUS | Status: AC
  Filled 2017-08-09: qty 1

## 2017-08-09 MED ORDER — HYDROMORPHONE HCL 1 MG/ML IJ SOLN
INTRAMUSCULAR | Status: AC
  Administered 2017-08-09: 0.5 mg via INTRAVENOUS
  Filled 2017-08-09: qty 0.5

## 2017-08-09 MED ORDER — VASOPRESSIN 20 UNIT/ML IV SOLN
INTRAVENOUS | Status: AC
  Filled 2017-08-09: qty 1

## 2017-08-09 MED ORDER — ONDANSETRON HCL 4 MG/2ML IJ SOLN
INTRAMUSCULAR | Status: AC
  Filled 2017-08-09: qty 2

## 2017-08-09 MED ORDER — ONDANSETRON HCL 4 MG/2ML IJ SOLN
4.0000 mg | Freq: Four times a day (QID) | INTRAMUSCULAR | Status: DC | PRN
  Administered 2017-08-10: 4 mg via INTRAVENOUS
  Filled 2017-08-09: qty 2

## 2017-08-09 MED ORDER — HYDROMORPHONE HCL 1 MG/ML IJ SOLN
INTRAMUSCULAR | Status: AC
  Filled 2017-08-09: qty 1

## 2017-08-09 MED ORDER — VASOPRESSIN 20 UNIT/ML IV SOLN
INTRAVENOUS | Status: DC | PRN
  Administered 2017-08-09: 20 mL via INTRAMUSCULAR

## 2017-08-09 MED ORDER — PROPOFOL 10 MG/ML IV BOLUS
INTRAVENOUS | Status: DC | PRN
  Administered 2017-08-09: 160 mg via INTRAVENOUS

## 2017-08-09 MED ORDER — MEPERIDINE HCL 25 MG/ML IJ SOLN
INTRAMUSCULAR | Status: AC
  Administered 2017-08-09: 12.5 mg via INTRAVENOUS
  Filled 2017-08-09: qty 1

## 2017-08-09 MED ORDER — FENTANYL CITRATE (PF) 250 MCG/5ML IJ SOLN
INTRAMUSCULAR | Status: AC
  Filled 2017-08-09: qty 5

## 2017-08-09 MED ORDER — MIDAZOLAM HCL 2 MG/2ML IJ SOLN
INTRAMUSCULAR | Status: AC
  Filled 2017-08-09: qty 2

## 2017-08-09 MED ORDER — IBUPROFEN 800 MG PO TABS
800.0000 mg | ORAL_TABLET | Freq: Three times a day (TID) | ORAL | Status: DC | PRN
  Administered 2017-08-10: 800 mg via ORAL
  Filled 2017-08-09: qty 1

## 2017-08-09 MED ORDER — PHENYLEPHRINE HCL 10 MG/ML IJ SOLN
INTRAMUSCULAR | Status: DC | PRN
  Administered 2017-08-09: 80 mg via INTRAVENOUS

## 2017-08-09 MED ORDER — SODIUM CHLORIDE 0.9 % IV SOLN: Freq: Once | INTRAVENOUS | Status: DC

## 2017-08-09 MED ORDER — CEFAZOLIN SODIUM-DEXTROSE 2-4 GM/100ML-% IV SOLN
2.0000 g | INTRAVENOUS | Status: AC
  Administered 2017-08-09: 2 g via INTRAVENOUS

## 2017-08-09 MED ORDER — SODIUM CHLORIDE 0.9 % IJ SOLN
INTRAMUSCULAR | Status: AC
  Filled 2017-08-09: qty 100

## 2017-08-09 MED ORDER — HYDROMORPHONE HCL 1 MG/ML IJ SOLN
INTRAMUSCULAR | Status: DC | PRN
  Administered 2017-08-09 (×2): .2 mg via INTRAVENOUS
  Administered 2017-08-09 (×2): .3 mg via INTRAVENOUS

## 2017-08-09 MED ORDER — BUPIVACAINE HCL (PF) 0.5 % IJ SOLN
INTRAMUSCULAR | Status: AC
  Filled 2017-08-09: qty 30

## 2017-08-09 MED ORDER — LACTATED RINGERS IV SOLN
INTRAVENOUS | Status: DC
  Administered 2017-08-09: 19:00:00 via INTRAVENOUS

## 2017-08-09 MED ORDER — PROMETHAZINE HCL 25 MG/ML IJ SOLN: 6.2500 mg | INTRAMUSCULAR | Status: DC | PRN

## 2017-08-09 MED ORDER — SUGAMMADEX SODIUM 200 MG/2ML IV SOLN
INTRAVENOUS | Status: AC
  Filled 2017-08-09: qty 2

## 2017-08-09 MED ORDER — SUGAMMADEX SODIUM 200 MG/2ML IV SOLN
INTRAVENOUS | Status: DC | PRN
  Administered 2017-08-09: 200 mg via INTRAVENOUS

## 2017-08-09 MED ORDER — DEXAMETHASONE SODIUM PHOSPHATE 4 MG/ML IJ SOLN
INTRAMUSCULAR | Status: AC
  Filled 2017-08-09: qty 1

## 2017-08-09 MED ORDER — DEXAMETHASONE SODIUM PHOSPHATE 4 MG/ML IJ SOLN
INTRAMUSCULAR | Status: DC | PRN
  Administered 2017-08-09: 4 mg via INTRAVENOUS

## 2017-08-09 MED ORDER — LACTATED RINGERS IV SOLN
INTRAVENOUS | Status: DC
  Administered 2017-08-09 (×3): via INTRAVENOUS

## 2017-08-09 MED ORDER — MEPERIDINE HCL 25 MG/ML IJ SOLN
6.2500 mg | INTRAMUSCULAR | Status: DC | PRN
  Administered 2017-08-09: 12.5 mg via INTRAVENOUS

## 2017-08-09 MED ORDER — LIDOCAINE HCL (CARDIAC) 20 MG/ML IV SOLN
INTRAVENOUS | Status: DC | PRN
  Administered 2017-08-09: 80 mg via INTRAVENOUS

## 2017-08-09 MED ORDER — HYDROMORPHONE HCL 1 MG/ML IJ SOLN
0.2500 mg | INTRAMUSCULAR | Status: DC | PRN
  Administered 2017-08-09 (×4): 0.5 mg via INTRAVENOUS

## 2017-08-09 MED ORDER — MIDAZOLAM HCL 2 MG/2ML IJ SOLN
INTRAMUSCULAR | Status: DC | PRN
  Administered 2017-08-09: 1 mg via INTRAVENOUS

## 2017-08-09 MED ORDER — CEFAZOLIN SODIUM-DEXTROSE 2-4 GM/100ML-% IV SOLN
INTRAVENOUS | Status: AC
  Filled 2017-08-09: qty 100

## 2017-08-09 MED ORDER — HYDROMORPHONE HCL 1 MG/ML IJ SOLN
INTRAMUSCULAR | Status: AC
  Filled 2017-08-09: qty 0.5

## 2017-08-09 SURGICAL SUPPLY — 36 items
CANISTER SUCT 3000ML PPV (MISCELLANEOUS) ×3 IMPLANT
CLOSURE WOUND 1/2 X4 (GAUZE/BANDAGES/DRESSINGS) ×1
CONT PATH 16OZ SNAP LID 3702 (MISCELLANEOUS) ×3 IMPLANT
DECANTER SPIKE VIAL GLASS SM (MISCELLANEOUS) IMPLANT
DRAIN PENROSE 1/4X12 LTX (DRAIN) ×3 IMPLANT
DRAPE CESAREAN BIRTH W POUCH (DRAPES) ×3 IMPLANT
DRSG OPSITE POSTOP 4X10 (GAUZE/BANDAGES/DRESSINGS) ×3 IMPLANT
DURAPREP 26ML APPLICATOR (WOUND CARE) ×6 IMPLANT
GAUZE SPONGE 4X4 16PLY XRAY LF (GAUZE/BANDAGES/DRESSINGS) ×3 IMPLANT
GLOVE BIO SURGEON STRL SZ 6.5 (GLOVE) ×2 IMPLANT
GLOVE BIO SURGEONS STRL SZ 6.5 (GLOVE) ×1
GLOVE BIOGEL PI IND STRL 7.0 (GLOVE) ×2 IMPLANT
GLOVE BIOGEL PI INDICATOR 7.0 (GLOVE) ×4
GOWN STRL REUS W/TWL LRG LVL3 (GOWN DISPOSABLE) ×9 IMPLANT
HEMOSTAT ARISTA ABSORB 3G PWDR (MISCELLANEOUS) ×3 IMPLANT
NEEDLE HYPO 22GX1.5 SAFETY (NEEDLE) ×3 IMPLANT
NEEDLE SPNL 18GX3.5 QUINCKE PK (NEEDLE) ×3 IMPLANT
NS IRRIG 1000ML POUR BTL (IV SOLUTION) ×3 IMPLANT
PACK ABDOMINAL GYN (CUSTOM PROCEDURE TRAY) ×3 IMPLANT
PAD OB MATERNITY 4.3X12.25 (PERSONAL CARE ITEMS) ×3 IMPLANT
PENCIL SMOKE EVAC W/HOLSTER (ELECTROSURGICAL) ×3 IMPLANT
PROTECTOR NERVE ULNAR (MISCELLANEOUS) ×3 IMPLANT
RTRCTR C-SECT PINK 25CM LRG (MISCELLANEOUS) IMPLANT
SPONGE LAP 18X18 X RAY DECT (DISPOSABLE) ×9 IMPLANT
STRIP CLOSURE SKIN 1/2X4 (GAUZE/BANDAGES/DRESSINGS) ×2 IMPLANT
SUT VIC AB 0 CT1 27 (SUTURE) ×20
SUT VIC AB 0 CT1 27XBRD ANBCTR (SUTURE) ×10 IMPLANT
SUT VIC AB 3-0 SH 27 (SUTURE)
SUT VIC AB 3-0 SH 27X BRD (SUTURE) IMPLANT
SUT VIC AB 4-0 SH 27 (SUTURE) ×8
SUT VIC AB 4-0 SH 27XANBCTRL (SUTURE) ×4 IMPLANT
SUT VICRYL 0 TIES 12 18 (SUTURE) IMPLANT
SYR 30ML LL (SYRINGE) ×3 IMPLANT
SYR CONTROL 10ML LL (SYRINGE) ×3 IMPLANT
TOWEL OR 17X24 6PK STRL BLUE (TOWEL DISPOSABLE) ×6 IMPLANT
TRAY FOLEY CATH SILVER 14FR (SET/KITS/TRAYS/PACK) ×3 IMPLANT

## 2017-08-09 NOTE — H&P (Signed)
Angelica Hall is an 42 y.o. female. Single P0 here for a myomectomy. She has been seen in the clinic due to pelvic pain and severe dysmenorrhea.She has DUB and large fibroids (uterus palpable up to her umbilicus). She desperately does NOT want a hysterectomy, holding onto hope that she may have a child   EXAM: ULTRASOUND PELVIS TRANSVAGINAL  TECHNIQUE: Transvaginal ultrasound examination of the pelvis was performed including evaluation of the uterus, ovaries, adnexal regions, and pelvic cul-de-sac. Transabdominal imaging was not ordered.  COMPARISON:  CT abdomen and pelvis 10/30/2015  FINDINGS: Uterus  Measurements: 12.2 x 10.413.5 cm. Enlarged uterus containing multiple masses consistent with leiomyomata. These include a posterior LEFT leiomyoma intramural 6.0 x 5.2 x 4.3 cm, a RIGHT anterior intramural leiomyoma 1.1 x 3.7 x 4.0 cm, and a RIGHT fundal intramural leiomyoma 5.5 x 4.7 x 4.9 cm.  Endometrium  Thickness: Not adequately visualized, likely obscured by presence of multiple uterine masses.  Right ovary  Nonvisualized on trans vaginal imaging question obscured by bowel gas  Left ovary  Measurements: 4.0 x 2.4 x 1.4 cm. Normal morphology without mass  Other findings:  No free pelvic fluid or adnexal masses.  IMPRESSION: Enlarged uterus containing multiple leiomyomata.  Nonvisualization of endometrium and RIGHT ovary.    Menstrual History: Menarche age: 68 Patient's last menstrual period was 07/19/2017.    Past Medical History:  Diagnosis Date  . Anemia   . Anxiety     History reviewed. No pertinent surgical history.  History reviewed. No pertinent family history.  Social History:  reports that she has been smoking cigarettes.  She has a 15.00 pack-year smoking history. She has never used smokeless tobacco. She reports that she drinks alcohol. She reports that she does not use drugs.  Allergies: No Known Allergies  Medications Prior to  Admission  Medication Sig Dispense Refill Last Dose  . desvenlafaxine (PRISTIQ) 50 MG 24 hr tablet Take 50 mg by mouth daily.   Past Week at Unknown time  . ferrous sulfate 325 (65 FE) MG tablet Take 325 mg daily with breakfast by mouth.   Past Week at Unknown time  . gabapentin (NEURONTIN) 300 MG capsule Take 300 mg by mouth 3 (three) times daily as needed (pain).   More than a month at Unknown time    ROS  Blood pressure 117/73, pulse 79, temperature 98.4 F (36.9 C), temperature source Oral, resp. rate 16, height 5\' 1"  (1.549 m), weight 51.3 kg (113 lb), last menstrual period 07/19/2017, SpO2 100 %. 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)  Abstinent for about 7 years  Results for orders placed or performed during the hospital encounter of 08/09/17 (from the past 24 hour(s))  Pregnancy, urine     Status: None   Collection Time: 08/09/17  1:22 PM  Result Value Ref Range   Preg Test, Ur NEGATIVE NEGATIVE  Basic metabolic panel     Status: Abnormal   Collection Time: 08/09/17  1:22 PM  Result Value Ref Range   Sodium 135 135 - 145 mmol/L   Potassium 3.6 3.5 - 5.1 mmol/L   Chloride 105 101 - 111 mmol/L   CO2 21 (L) 22 - 32 mmol/L   Glucose, Bld 89 65 - 99 mg/dL   BUN 10 6 - 20 mg/dL   Creatinine, Ser 0.67 0.44 - 1.00 mg/dL   Calcium 8.6 (L) 8.9 - 10.3 mg/dL   GFR calc non Af Amer >60 >60 mL/min  GFR calc Af Amer >60 >60 mL/min   Anion gap 9 5 - 15    No results found.  Assessment/Plan: Symptomatic fibroids- Because she is nulliparous and very desirous of keeping her uterus, I have agreed to do a myomectomy. She is aware that there is a good chance that she will need a blood transfusion.  She understands the risks of surgery, including, but not to infection, bleeding, DVTs, damage to bowel, bladder, ureters. She wishes to proceed. She understands that fibroids can come back.     [name] 08/09/2017, 2:26 PM

## 2017-08-09 NOTE — Progress Notes (Signed)
[place] drsg 3/4 saturated with blood, dsg, removed and pressure dsg applied.

## 2017-08-09 NOTE — Anesthesia Procedure Notes (Signed)
Procedure Name: Intubation Date/Time: 08/09/2017 2:53 PM Performed by: Elenore Paddy, CRNA Pre-anesthesia Checklist: Patient identified, Emergency Drugs available, Suction available and Patient being monitored Patient Re-evaluated:Patient Re-evaluated prior to induction Oxygen Delivery Method: Circle system utilized Preoxygenation: Pre-oxygenation with 100% oxygen Induction Type: IV induction Ventilation: Mask ventilation without difficulty Laryngoscope Size: Mac and 3 Grade View: Grade I Tube type: Oral Tube size: 7.0 mm Number of attempts: 1 Airway Equipment and Method: Stylet Placement Confirmation: ETT inserted through vocal cords under direct vision,  positive ETCO2,  CO2 detector and breath sounds checked- equal and bilateral Secured at: 21 cm Tube secured with: Tape Dental Injury: Teeth and Oropharynx as per pre-operative assessment

## 2017-08-09 NOTE — Transfer of Care (Signed)
Immediate Anesthesia Transfer of Care Note  Patient: Angelica Hall  Procedure(s) Performed: ABDOMINAL MYOMECTOMY (N/A Abdomen)  Patient Location: PACU  Anesthesia Type:General  Level of Consciousness: awake, alert  and oriented  Airway & Oxygen Therapy: Patient Spontanous Breathing and Patient connected to nasal cannula oxygen  Post-op Assessment: Report given to RN and Post -op Vital signs reviewed and stable  Post vital signs: Reviewed and stable  Last Vitals:  Vitals Value Taken Time  BP    Temp    Pulse 92 08/09/2017  5:02 PM  Resp 10 08/09/2017  5:02 PM  SpO2 100 % 08/09/2017  5:02 PM  Vitals shown include unvalidated device data.  Last Pain:  Vitals:   08/09/17 1329  TempSrc: Oral  PainSc: 5          Complications: No apparent anesthesia complications

## 2017-08-09 NOTE — Anesthesia Preprocedure Evaluation (Addendum)
Anesthesia Evaluation  Patient identified by MRN, date of birth, ID band Patient awake    Reviewed: Allergy & Precautions, NPO status , Patient's Chart, lab work & pertinent test results  Airway Mallampati: I  TM Distance: >3 FB Neck ROM: Full    Dental  (+) Teeth Intact, Dental Advisory Given   Pulmonary Current Smoker,    breath sounds clear to auscultation       Cardiovascular negative cardio ROS   Rhythm:Regular Rate:Normal     Neuro/Psych Anxiety negative neurological ROS     GI/Hepatic negative GI ROS, Neg liver ROS,   Endo/Other  negative endocrine ROS  Renal/GU negative Renal ROS     Musculoskeletal   Abdominal Normal abdominal exam  (+)   Peds negative pediatric ROS (+)  Hematology negative hematology ROS (+)   Anesthesia Other Findings   Reproductive/Obstetrics                            Anesthesia Physical Anesthesia Plan  ASA: II  Anesthesia Plan: General   Post-op Pain Management:    Induction: Intravenous  PONV Risk Score and Plan: 3 and Ondansetron, Dexamethasone and Midazolam  Airway Management Planned: Oral ETT  Additional Equipment: None  Intra-op Plan:   Post-operative Plan: Extubation in OR  Informed Consent: I have reviewed the patients History and Physical, chart, labs and discussed the procedure including the risks, benefits and alternatives for the proposed anesthesia with the patient or authorized representative who has indicated his/her understanding and acceptance.     Plan Discussed with: CRNA  Anesthesia Plan Comments:       Anesthesia Quick Evaluation

## 2017-08-09 NOTE — Anesthesia Postprocedure Evaluation (Signed)
Anesthesia Post Note  Patient: Angelica Hall  Procedure(s) Performed: ABDOMINAL MYOMECTOMY (N/A Abdomen)     Patient location during evaluation: PACU Anesthesia Type: General Level of consciousness: awake and alert and oriented Pain management: pain level controlled Vital Signs Assessment: post-procedure vital signs reviewed and stable Respiratory status: spontaneous breathing, nonlabored ventilation, respiratory function stable and patient connected to nasal cannula oxygen Cardiovascular status: blood pressure returned to baseline and stable Postop Assessment: no apparent nausea or vomiting Anesthetic complications: no    Last Vitals:  Vitals:   08/09/17 1745 08/09/17 1800  BP: 127/81 121/70  Pulse: 73 71  Resp: 12 14  Temp:    SpO2: 100% 100%    Last Pain:  Vitals:   08/09/17 1329  TempSrc: Oral  PainSc: 5    Pain Goal:                 [name] A.

## 2017-08-10 ENCOUNTER — Encounter (HOSPITAL_COMMUNITY): Payer: Self-pay | Admitting: Obstetrics & Gynecology

## 2017-08-10 DIAGNOSIS — D259 Leiomyoma of uterus, unspecified: Secondary | ICD-10-CM

## 2017-08-10 DIAGNOSIS — N938 Other specified abnormal uterine and vaginal bleeding: Secondary | ICD-10-CM

## 2017-08-10 LAB — CBC
HEMATOCRIT: 17.3 % — AB (ref 36.0–46.0)
Hemoglobin: 6.3 g/dL — CL (ref 12.0–15.0)
MCH: 34.8 pg — AB (ref 26.0–34.0)
MCHC: 36.4 g/dL — AB (ref 30.0–36.0)
MCV: 95.6 fL (ref 78.0–100.0)
Platelets: 200 10*3/uL (ref 150–400)
RBC: 1.81 MIL/uL — ABNORMAL LOW (ref 3.87–5.11)
RDW: 13.3 % (ref 11.5–15.5)
WBC: 12.4 10*3/uL — AB (ref 4.0–10.5)

## 2017-08-10 LAB — PREPARE RBC (CROSSMATCH)

## 2017-08-10 MED ORDER — LACTATED RINGERS IV SOLN
INTRAVENOUS | Status: DC
  Administered 2017-08-10: 02:00:00 via INTRAVENOUS

## 2017-08-10 MED ORDER — OXYCODONE-ACETAMINOPHEN 5-325 MG PO TABS
1.0000 | ORAL_TABLET | ORAL | Status: DC | PRN
  Administered 2017-08-10: 2 via ORAL
  Administered 2017-08-10 (×2): 1 via ORAL
  Administered 2017-08-11 (×2): 2 via ORAL
  Administered 2017-08-11: 1 via ORAL
  Filled 2017-08-10 (×2): qty 1
  Filled 2017-08-10 (×3): qty 2
  Filled 2017-08-10: qty 1

## 2017-08-10 MED ORDER — SODIUM CHLORIDE 0.9 % IV SOLN
Freq: Once | INTRAVENOUS | Status: AC
  Administered 2017-08-10: 09:00:00 via INTRAVENOUS

## 2017-08-10 MED ORDER — DIPHENHYDRAMINE HCL 25 MG PO CAPS
25.0000 mg | ORAL_CAPSULE | Freq: Once | ORAL | Status: AC
Start: 2017-08-10 — End: 2017-08-10
  Administered 2017-08-10: 25 mg via ORAL
  Filled 2017-08-10: qty 1

## 2017-08-10 NOTE — Op Note (Signed)
08/09/2017  8:13 AM  PATIENT:  Angelica Hall  42 y.o. female  PRE-OPERATIVE DIAGNOSIS:  Fibroids  DUB  POST-OPERATIVE DIAGNOSIS:  fibroids, dysfunctional uterine bleeding  PROCEDURE:  Procedure(s): ABDOMINAL MYOMECTOMY (N/A)  SURGEON:  Surgeon(s) and Role:    * Kemonte Ullman C, MD - Primary   ANESTHESIA:   local and general  EBL:  1000 mL   BLOOD ADMINISTERED:none  DRAINS: none   LOCAL MEDICATIONS USED:  MARCAINE     SPECIMEN:  Source of Specimen:  fibroids  DISPOSITION OF SPECIMEN:  PATHOLOGY  COUNTS:  YES  TOURNIQUET:  * No tourniquets in log *  DICTATION: .Dragon Dictation  PLAN OF CARE: Admit to inpatient   PATIENT DISPOSITION:  PACU - hemodynamically stable.   Delay start of Pharmacological VTE agent (>24hrs) due to surgical blood loss or risk of bleeding: not applicable     The risks, benefits, and alternatives of surgery were explained, understood, and accepted. Consents were signed. All questions were answered. She was taken to the operating room and spinal anesthesia was applied without complication. Her abdomen was prepped and draped in the usual sterile fashion. A Foley catheter was placed which drained clear urine throughout the case. A transverse incision was made approximately 2 cm above her symphysis pubis after injecting 30 mL of 0.5% marcaine in the subcutaneous tissue. The incision was carried down through the subcutaneous tissue to the fascia. Bleeding encountered was cauterized with the Bovie. The fascia was scored the midline and the fascial incision was extended bilaterally. The pyramidalis muscles were separated in a transverse fashion using electrosurgical technique. Approximately 1 cm of the rectus muscles were separated in a transverse fashion in the midline using electrosurgical technique. Hemostasis was maintained. The peritoneum was entered with hemostats and the peritoneal incision was extended bilaterally with the Bovie, taking care to avoid  bowel and bladder. The patient was placed in Trendelenburg position and her bowel was packed out of the abdominal cavity. The pelvis was inspected. Her very large uterus filled the entire pelvis. I used towel clamps to elevate the uterus out of the incision. I then created a window in each mesosalpinx and placed a tourniquet around the cervix, obstructing the uterine arteries to aid with hemostasis.  I then injected dilute pitressin into the serosa. I made an incision on the front and back of the uterus in a vertical fashion. I then excised multiple fibroids by shelling them out while elevating them with towel clamps.  There were still a few small serosal fibroids that I deemed to be not contributing to her bleeding and pain. They were left in situ. I then closed the myometrium in layers using 0 vicryl suture. The serosa was closed with a baseball stitch. I released the tourniquet. There were a few areas that were oozing from the suture line. I placed a 0 vicryl figure of 8 suture at these sites. Hemostasis was achieved. The uterus was placed back into to pelvis. I placed Arista over the visible suture line. Hemostasis was noted. The rectus muscles were inspected and hemostasis was assured. The fascia was closed with a 0 Vicryl running nonlocking suture. A subcuticular closure was done with 3-0 vicryl suture. She tolerated the procedure well and was taken to the recovery room in stable condition. Her Foley catheter drained clear urine throughout.

## 2017-08-10 NOTE — Progress Notes (Signed)
1 Day Post-Op Procedure(s) (LRB): ABDOMINAL MYOMECTOMY (N/A)  Subjective: Patient reports tolerating PO and no problems voiding.    Objective: I have reviewed patient's vital signs, intake and output, medications and labs.  General: alert Resp: clear to auscultation bilaterally Cardio: regular rate and rhythm, S1, S2 normal, no murmur, click, rub or gallop GI: soft, non-tender; bowel sounds normal; no masses,  no organomegaly Pressure dressing clean Assessment: s/p Procedure(s): ABDOMINAL MYOMECTOMY (N/A): stable  Plan: Advance diet  Finish transfusion Check CBC in the morning and if hbg stable, then probable discharge home tomorrow  LOS: 1 day    Angelica Hall 08/10/2017, 12:31 PM

## 2017-08-10 NOTE — Plan of Care (Signed)
  Problem: Education: Goal: Knowledge of General Education information will improve Outcome: Progressing   Problem: Health Behavior/Discharge Planning: Goal: Ability to manage health-related needs will improve Outcome: Progressing   Problem: Clinical Measurements: Goal: Will remain free from infection Outcome: Progressing Goal: Respiratory complications will improve Outcome: Progressing   Problem: Activity: Goal: Risk for activity intolerance will decrease Outcome: Progressing   Problem: Coping: Goal: Level of anxiety will decrease Outcome: Progressing

## 2017-08-11 LAB — TYPE AND SCREEN
ABO/RH(D): A POS
ANTIBODY SCREEN: NEGATIVE
UNIT DIVISION: 0
UNIT DIVISION: 0
Unit division: 0
Unit division: 0

## 2017-08-11 LAB — BPAM RBC
BLOOD PRODUCT EXPIRATION DATE: 201904062359
BLOOD PRODUCT EXPIRATION DATE: 201904222359
Blood Product Expiration Date: 201904082359
Blood Product Expiration Date: 201904232359
ISSUE DATE / TIME: 201903221026
ISSUE DATE / TIME: 201903221204
ISSUE DATE / TIME: 201903220851
ISSUE DATE / TIME: 201903221338
UNIT TYPE AND RH: 600
UNIT TYPE AND RH: 6200
Unit Type and Rh: 6200
Unit Type and Rh: 6200

## 2017-08-11 MED ORDER — IBUPROFEN 800 MG PO TABS: 800.0000 mg | ORAL_TABLET | Freq: Three times a day (TID) | ORAL | 0 refills | Status: AC | PRN

## 2017-08-11 MED ORDER — OXYCODONE-ACETAMINOPHEN 5-325 MG PO TABS: 1.0000 | ORAL_TABLET | ORAL | 0 refills | Status: AC | PRN

## 2017-08-11 NOTE — Discharge Instructions (Signed)
Exploratory Laparotomy, Adult, Care After °Refer to this sheet in the next few weeks. These instructions provide you with information about caring for yourself after your procedure. Your health care provider may also give you more specific instructions. Your treatment has been planned according to current medical practices, but problems sometimes occur. Call your health care provider if you have any problems or questions after your procedure. °What can I expect after the procedure? °After your procedure, it is typical to have: °· Abdominal soreness. °· Fatigue. °· A sore throat from tubes in your throat. °· A lack of appetite. ° °Follow these instructions at home: °Medicines °· Take medicines only as directed by your health care provider. °· Do not drive or operate heavy machinery while taking pain medicine. °Incision care °· There are many different ways to close and cover an incision, including stitches (sutures), skin glue, and adhesive strips. Follow your health care provider's instructions about: °? Incision care. °? Bandage (dressing) changes and removal. °? Incision closure removal. °· Do not take showers or baths until your health care provider says that you can. °· Check your incision area daily for signs of infection. Watch for: °? Redness. °? Tenderness. °? Swelling. °? Drainage. °Activity °· Do not lift anything that is heavier than 10 pounds (4.5 kg) until your health care provider says that it is safe. °· Try to walk a little bit each day if your health care provider says that it is okay. °· Ask your health care provider when you can start to do your usual activities again, such as driving, going back to work, and having sex. °Eating and drinking °· You may eat what you usually eat. Include lots of whole grains, fruits, and vegetables in your diet. This will help to prevent constipation. °· Drink enough fluid to keep your urine clear or pale yellow. °General instructions °· Keep all follow-up visits as  directed by your health care provider. This is important. °Contact a health care provider if: °· You have a fever. °· You have chills. °· Your pain medicine is not helping. °· You have constipation or diarrhea. °· You have nausea or vomiting. °· You have drainage, redness, swelling, or pain at your incision site. °Get help right away if: °· Your pain is getting worse. °· It has been more than 3 days since you been able to have a bowel movement. °· You have ongoing (persistent) vomiting. °· The edges of your incision open up. °· You have warmth, tenderness, and swelling in your calf. °· You have trouble breathing. °· You have chest pain. °This information is not intended to replace advice given to you by your health care provider. Make sure you discuss any questions you have with your health care provider. °Document Released: 12/21/2003 Document Revised: 10/14/2015 Document Reviewed: 12/24/2013 °Elsevier Interactive Patient Education © 2018 [employer] ° °

## 2017-08-11 NOTE — Discharge Summary (Signed)
Physician Discharge Summary  Patient ID: Angelica Hall MRN: [id number] DOB/AGE: 24-Nov-1975 42 y.o.  Admit date: 08/09/2017 Discharge date: 08/11/2017  Admission Diagnoses: symptomatic fibroids  Discharge Diagnoses: same Active Problems:   Post-operative state   Discharged Condition: good  Hospital Course: She underwent an uncomplicated myomectomy. Her post op hbg was 6.3, so she was given 4 units of PRBCs on POD #1. She was voiding, tolerating po well, having flatus by POD #1. She voiced her readiness to go home on POD #2. Her post transfusion hbg was 11.3  Consults: None  Significant Diagnostic Studies: labs: as above  Treatments: surgery: myomectomy (removed 17 fibroids)  Discharge Exam: Blood pressure 129/83, pulse 79, temperature 98.6 F (37 C), temperature source Oral, resp. rate 18, height 5\' 1"  (1.549 m), weight 51.3 kg (113 lb), last menstrual period 07/19/2017, SpO2 97 %. General appearance: alert Resp: clear to auscultation bilaterally Breasts: normal appearance, no masses or tenderness GI: soft, non-tender; bowel sounds normal; no masses,  no organomegaly Incision/Wound: clean, dry, intact  Disposition: Discharge disposition: 01-Home or Self Care         Follow-up Information    [name], MD. Schedule an appointment as soon as possible for a visit in 5 weeks.   Specialty:  Obstetrics and Gynecology Contact information: [place] [address] [phone]           Signed: [name] 08/11/2017, 11:31 AM

## 2017-08-11 NOTE — Plan of Care (Signed)
Patient is resting in bed at this time. Reports pain at 7 out of 10. PRN Percocet x2 given per Patient's request. Has ambulated to the bathroom without any difficulty. Patient is voiding and passing gas.

## 2017-08-11 NOTE — Progress Notes (Signed)
All discharge teaching completed with the patient. All printed discharge instructions given and explained to the patient. Patient verbalizes an understanding of all instructions given. No questions or concerns verbalized at this time.

## 2017-08-12 LAB — CBC
HCT: 31.7 % — ABNORMAL LOW (ref 36.0–46.0)
Hemoglobin: 11.3 g/dL — ABNORMAL LOW (ref 12.0–15.0)
MCH: 31.7 pg (ref 26.0–34.0)
MCHC: 35.6 g/dL (ref 30.0–36.0)
MCV: 88.8 fL (ref 78.0–100.0)
PLATELETS: 133 10*3/uL — AB (ref 150–400)
RBC: 3.57 MIL/uL — AB (ref 3.87–5.11)
RDW: 15.6 % — AB (ref 11.5–15.5)
WBC: 14.3 10*3/uL — AB (ref 4.0–10.5)

## 2017-09-11 ENCOUNTER — Ambulatory Visit: Payer: Self-pay | Admitting: Obstetrics & Gynecology

## 2017-09-20 ENCOUNTER — Ambulatory Visit: Payer: Self-pay | Admitting: Obstetrics & Gynecology

## 2017-10-01 ENCOUNTER — Ambulatory Visit: Payer: Self-pay | Admitting: Obstetrics & Gynecology

## 2017-10-08 ENCOUNTER — Encounter: Payer: Self-pay | Admitting: Obstetrics & Gynecology

## 2017-10-08 ENCOUNTER — Other Ambulatory Visit: Payer: Self-pay

## 2017-10-08 ENCOUNTER — Ambulatory Visit (INDEPENDENT_AMBULATORY_CARE_PROVIDER_SITE_OTHER): Payer: Self-pay | Admitting: Obstetrics & Gynecology

## 2017-10-08 VITALS — BP 128/93 | HR 72 | Temp 98.1°F | Wt 112.5 lb

## 2017-10-08 DIAGNOSIS — Z9889 Other specified postprocedural states: Secondary | ICD-10-CM

## 2017-10-08 NOTE — Progress Notes (Signed)
   Subjective:    Patient ID: Angelica Hall, female    DOB: 10-Dec-1975, 42 y.o.   MRN: [id number]  HPI  42 yo single P0 here for a post op visit. She had a myomectomy (a 7.5 cm fibroid). She reports that her last period was much, much lighter. She has had no post op problems. She is not sexually active at this time.  Review of Systems     Objective:   Physical Exam Breathing, conversing, and ambulating normally Well nourished, well hydrated Black female, no apparent distress Abd- benign Incision- healing well     Assessment & Plan:   Post op- doing well Rec no attempt at pregnancy for at least 6 months

## 2018-06-30 IMAGING — US US TRANSVAGINAL NON-OB
1 series · 15 of 25 positions shown · non-contrast
Comparison: CT abdomen and pelvis 10/30/2015

CLINICAL DATA: Uterine fibroids, follow-up, chronic dysmenorrhea

EXAM:
ULTRASOUND PELVIS TRANSVAGINAL
TECHNIQUE: Transvaginal ultrasound examination of the pelvis was performed
including evaluation of the uterus, ovaries, adnexal regions, and
pelvic cul-de-sac. Transabdominal imaging was not ordered.

[Series 1: us transvaginal non-ob · 15 of 42 slices shown]
[im 1/42]
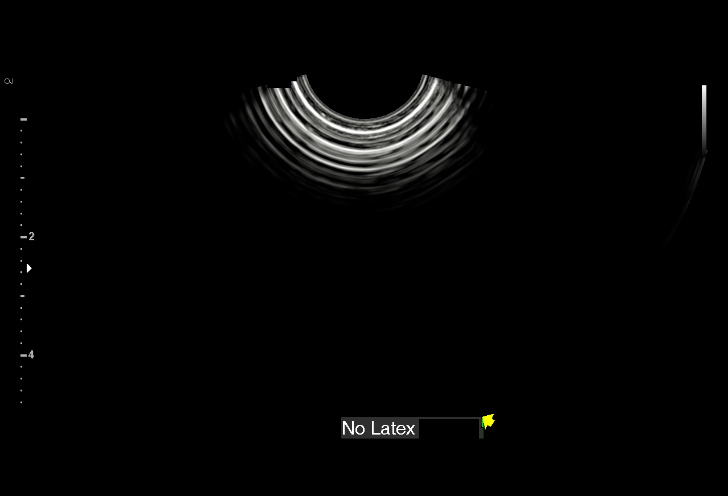
[im 4/42]
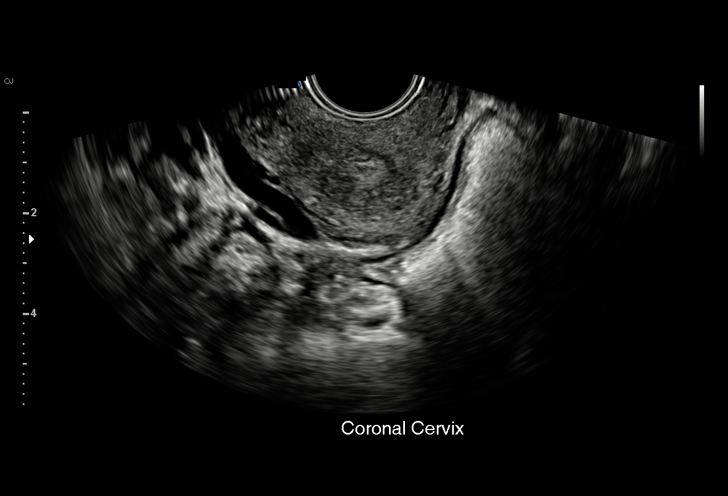
[im 7/42]
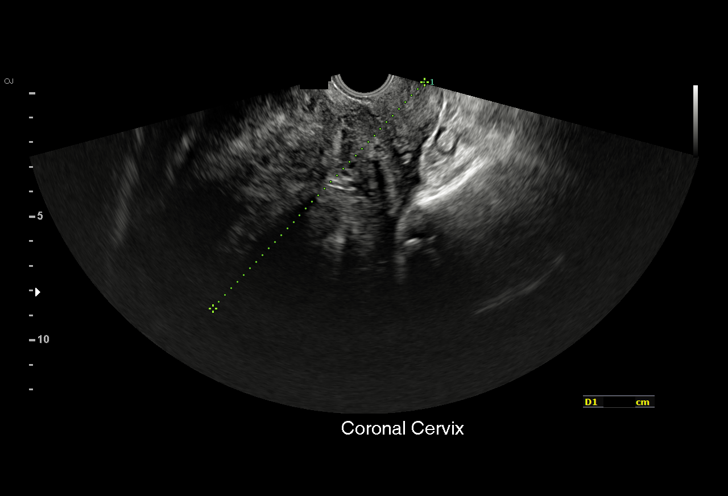
[im 9/42]
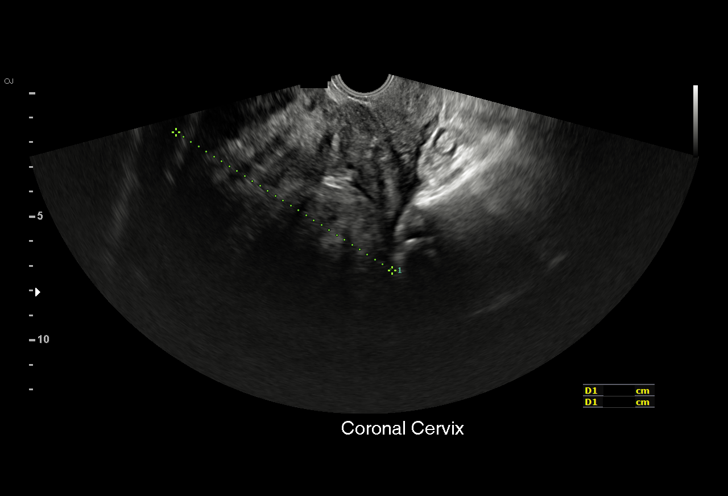
[im 12/42]
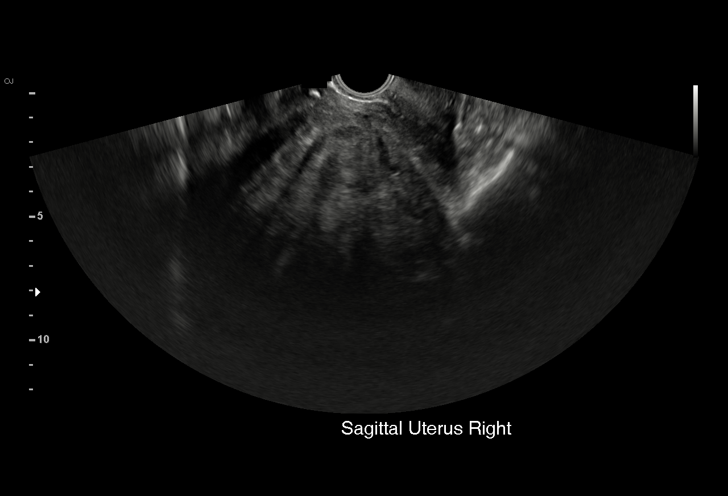
[im 16/42]
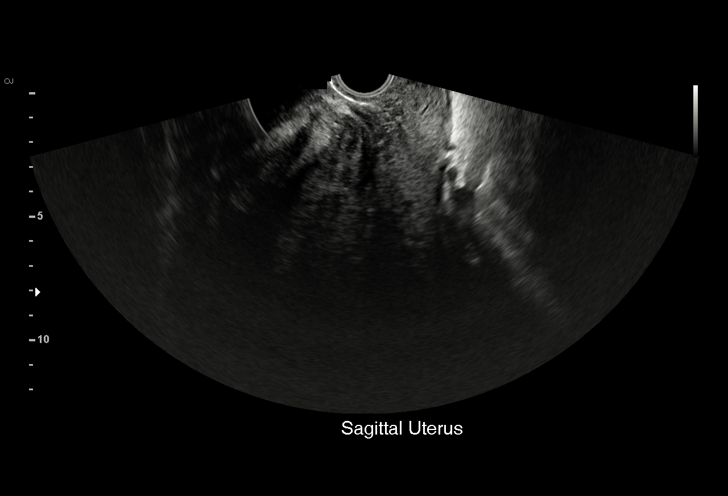
[im 18/42]
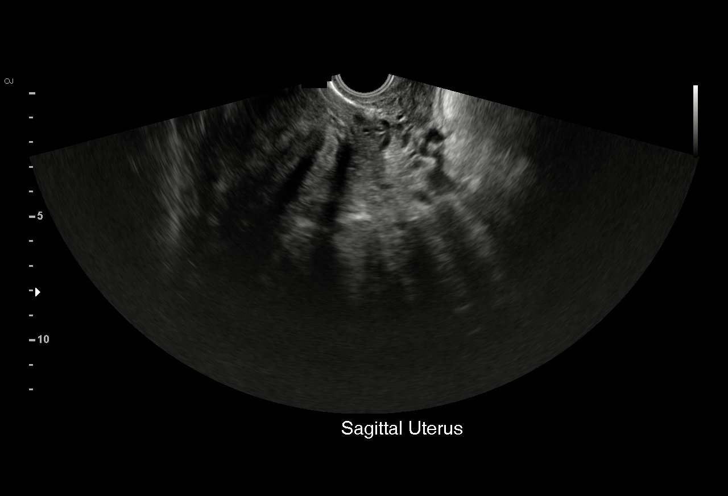
[im 21/42]
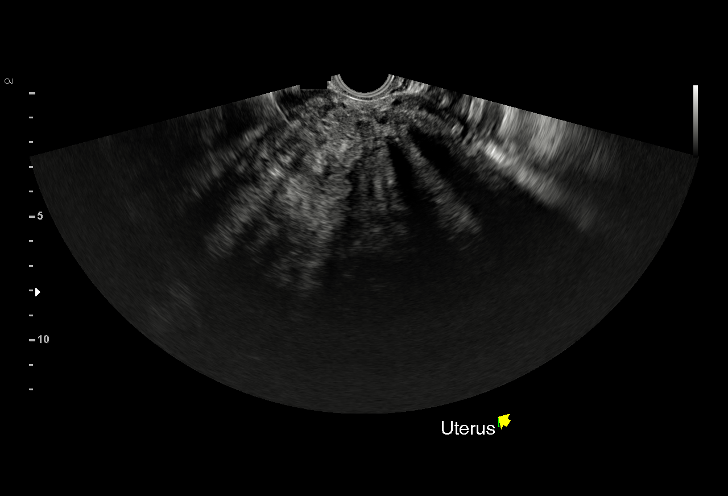
[im 24/42]
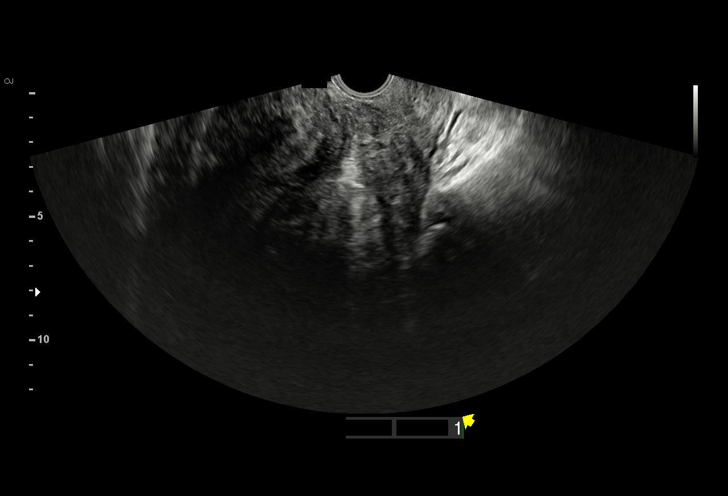
[im 26/42]
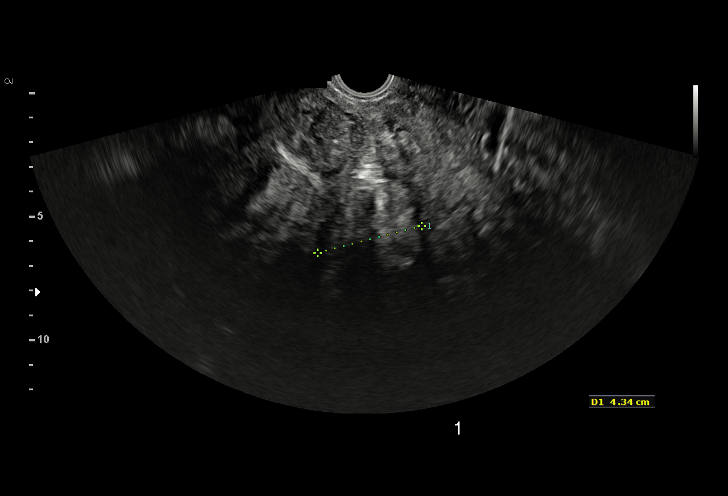
[im 30/42]
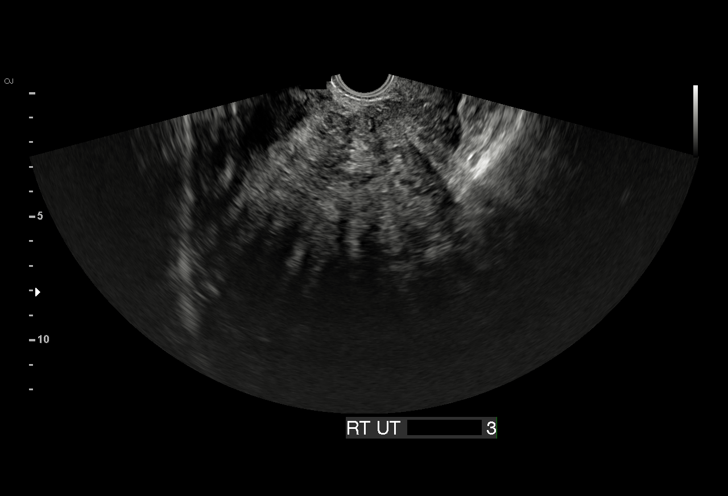
[im 33/42]
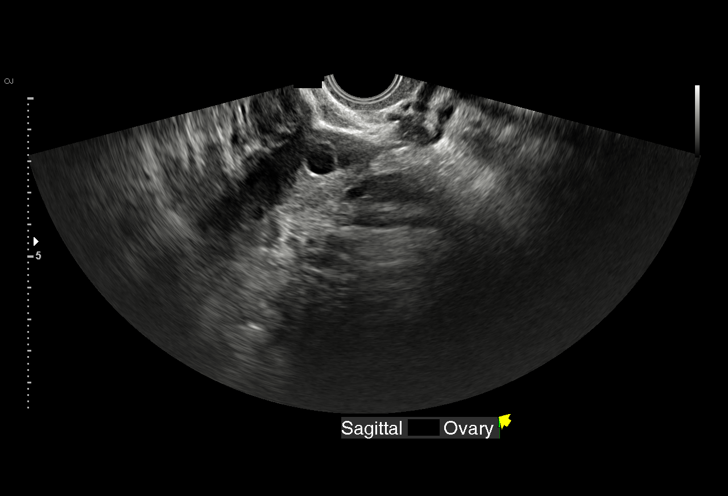
[im 35/42]
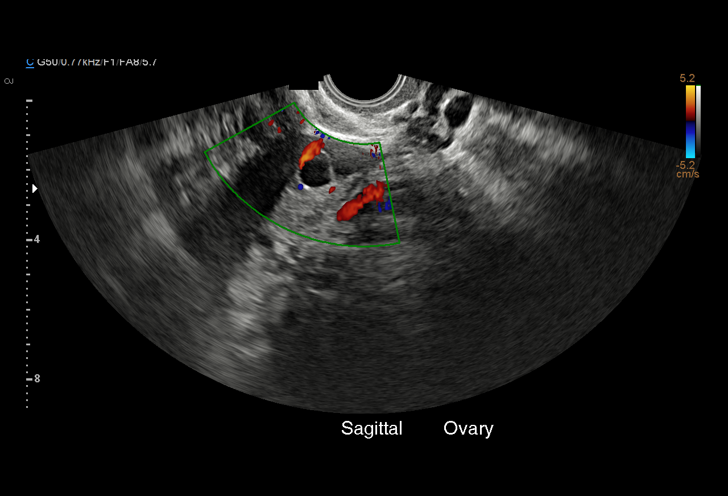
[im 38/42]
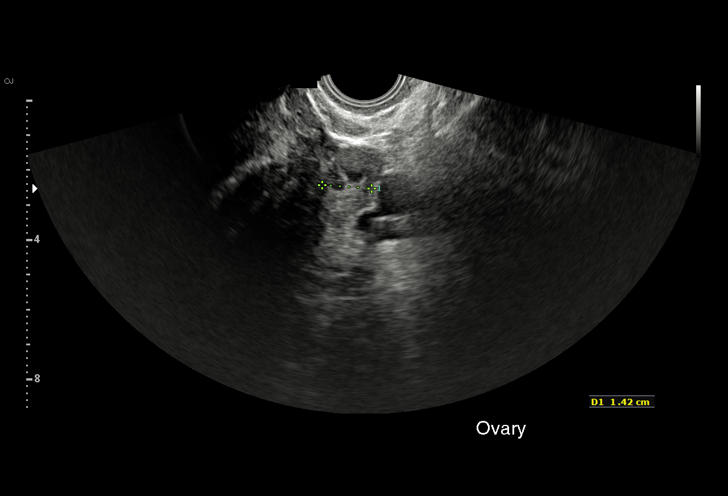
[im 42/42]
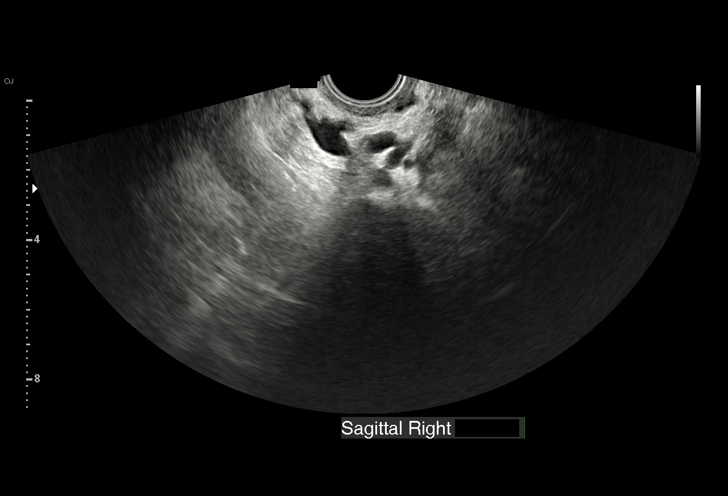

[15 of 25 positions shown; findings below may reference images not displayed]

FINDINGS: Uterus

Measurements: 12.2 x 10.413.5 cm. Enlarged uterus containing
multiple masses consistent with leiomyomata. These include a
posterior LEFT leiomyoma intramural 6.0 x 5.2 x 4.3 cm, a RIGHT
anterior intramural leiomyoma 1.1 x 3.7 x 4.0 cm, and a RIGHT fundal
intramural leiomyoma 5.5 x 4.7 x 4.9 cm.

Endometrium

Thickness: Not adequately visualized, likely obscured by presence of
multiple uterine masses.

Right ovary

Nonvisualized on trans vaginal imaging question obscured by bowel
gas

Left ovary

Measurements: 4.0 x 2.4 x 1.4 cm. Normal morphology without mass

Other findings:  No free pelvic fluid or adnexal masses.
IMPRESSION: Enlarged uterus containing multiple leiomyomata.

Nonvisualization of endometrium and RIGHT ovary.

## 2022-08-07 ENCOUNTER — Ambulatory Visit: Payer: Self-pay | Attending: Podiatry | Admitting: Podiatry

## 2022-08-07 DIAGNOSIS — M2012 Hallux valgus (acquired), left foot: Secondary | ICD-10-CM

## 2022-08-07 NOTE — Progress Notes (Signed)
   No chief complaint on file.   Subjective: 47 y.o. female [facility] of a symptomatic bunion to the left great toe as well as discoloration with some thickening to the left second toe.  Patient states that she does have a history of injury to the left second toe.  She noticed since that time that the toe became somewhat discolored and thicker.  Patient also has been dealing with a bunion for several years.  Aggravated by certain shoes.  She has not done anything for treatment.  Past Medical History:  Diagnosis Date   Anemia    Anxiety     Past Surgical History:  Procedure Laterality Date   MYOMECTOMY N/A 08/09/2017   Procedure: ABDOMINAL MYOMECTOMY;  Surgeon: [name], MD;  Location: [facility];  Service: Gynecology;  Laterality: N/A;    No Known Allergies   Objective: Physical Exam General: The patient is alert and oriented x3 in no acute distress.  Dermatology: Skin is cool, dry and supple bilateral lower extremities. Negative for open lesions or macerations.  Hyperkeratotic dystrophic toenail noted to the left second toe  Vascular: Palpable pedal pulses bilaterally. No edema or erythema noted. Capillary refill within normal limits.  Neurological: Epicritic and protective threshold grossly intact bilaterally.   Musculoskeletal Exam: Clinical evidence of mild bunion deformity noted to the respective foot.  Good range of motion.  There is no crepitus with range of motion although the patient states that there is some tenderness with range of motion.  Lateral deviation of the hallux noted with an increased hallux abductus angle   Assessment: 1.  Mild to moderate bunion deformity left -Patient evaluated.  Recommend wide fitting shoes that do not irritate or constrict the toebox area or bunion deformity -Advised against going barefoot.  Recommend good supportive shoes and sneakers even around the  house -Recommend padding of the bunion as needed  2.  Dystrophic toenail left second digit -Debridement of the toenail was performed with a nail nipper without incident or bleeding.  The patient did feel some relief.  This does not appear to be fungal related but more secondary to trauma of the nail plate -Patient was inquiring about supplements that would help improve the health of the nail.  Recommend biotin supplement -Return to clinic as needed      Edrick Kins, DPM [facility]  Dr. Edrick Kins, DPM    [address], [place] [address]                Office [phone]  Fax [phone]

## 2023-02-27 LAB — GLUCOSE, POCT (MANUAL RESULT ENTRY): Glucose Fasting, POC: 104 mg/dL — AB (ref 70–99)

## 2023-02-28 NOTE — Progress Notes (Signed)
Patient attended screening event 02/27/2023. Patient advised to follow up with provider for blood pressure of 150/103 and blood glucose of 104. Patient does not have SDOH needs at this time.

## 2023-06-08 NOTE — Progress Notes (Signed)
The patient attended 02/27/23 screening event where her BP screening results was 150/103. At the event the patient noted she has insurance and doesn't smoke or live with someone who does. Patient did not have any SDOH insecurities. Per chart review pt has a pcp but has not seen the pcp within the past twelve months. Chart review also indicates a future dentist appt 06/21/23.  CHW will call pt to try to get her connected to a pcp and express the importance of her BP. CHW called pt on the number in the system and the number listed on the consent form. Pt did not answer either call, CHW was unable to leave a VM. CHW mailed an abnormal results letter, pt pcp resources and will do a sixty day follow up.

## 2023-07-10 ENCOUNTER — Other Ambulatory Visit: Payer: Self-pay | Admitting: Family Medicine

## 2023-07-10 DIAGNOSIS — Z1231 Encounter for screening mammogram for malignant neoplasm of breast: Secondary | ICD-10-CM

## 2023-08-08 ENCOUNTER — Encounter: Payer: Self-pay | Admitting: *Deleted

## 2023-08-08 NOTE — Progress Notes (Signed)
 Pt attended 02/27/23 screening event where her b/p on recheck was 153/103 and her blood sugar was 104. At the event, the pt did not identify any PCP, did not having insurance, noted not being a smoker and did not identify any SDOH insecurities. During 60 day event f/u, health equity team member unable to contact pt by phone but vm left. Chart review indicates pt has been f/u with [facility] office and last b/p on 07/31/23 was 127/87 (at same office on 06/21/23, b/p  was 138/88). There are no [facility]-visible PCP encounters and dental visits note "no PCP" in pt's chart. Pt's last known address confirmed by [facility] demographics  on 07/31/23 used and letter sent to pt with [facility] and [facility] flyers in case pt still has no PCP. Additional pt f/u to be scheduled per health equity protocol

## 2024-01-07 ENCOUNTER — Encounter: Payer: Self-pay | Admitting: *Deleted

## 2024-01-07 NOTE — Progress Notes (Signed)
 Pt attended 02/27/23 screening event where her b/p was 150/103. At the event, the pt did not document a PCP name and did note not having any SDOH needs, having insurance, and not being a smoker. During the initial and 60 day health equity team f/u, CHW and nurse unable to contact pt by phone bur, since no PCP encounters visible in [facility], PCP clinic info was mailed to pt during those f/u attempts.  Chart review indicates pt has been f/u with [facility] office and last b/p on 07/31/23 was 127/87 (at same office on 06/21/23, b/p was 138/88). Chart review also indicated today that pt has been seen on 07/10/23 by NP Emerald Picarra when a mmg was ordered. Today, during conversation with pt, pt thanked the CHW for sending information and confirmed she is now seeing Kevin Danker NP as her PCP (last visit was 07/10/23) at the [facility], a PCP clinic that does not chart in [facility] so no future encounter dates visible in [facility]. She also noted her b/p continued to be normal and she had no current SDOH needs. No additional health equity team support indicated at this time.
